# Patient Record
Sex: Female | Born: 1965 | Race: White | Hispanic: No | Marital: Married | Smoking: Never smoker
Health system: Southern US, Community
[De-identification: ages and names within clinical notes are randomized; demographics above are authoritative.]

## PROBLEM LIST (undated history)

## (undated) DIAGNOSIS — I10 Essential (primary) hypertension: Secondary | ICD-10-CM

## (undated) HISTORY — PX: NASAL SINUS SURGERY: SHX719

---

## 2007-08-11 ENCOUNTER — Emergency Department: Payer: Self-pay | Admitting: Emergency Medicine

## 2008-03-01 ENCOUNTER — Ambulatory Visit: Payer: Self-pay | Admitting: Family Medicine

## 2010-01-06 ENCOUNTER — Ambulatory Visit: Payer: Self-pay | Admitting: Family Medicine

## 2011-01-25 HISTORY — PX: ABDOMINAL HYSTERECTOMY: SHX81

## 2011-06-02 ENCOUNTER — Ambulatory Visit: Payer: Self-pay | Admitting: Internal Medicine

## 2011-07-03 ENCOUNTER — Emergency Department: Payer: Self-pay | Admitting: Unknown Physician Specialty

## 2011-08-09 ENCOUNTER — Ambulatory Visit: Payer: Self-pay | Admitting: Internal Medicine

## 2011-09-30 ENCOUNTER — Ambulatory Visit: Payer: Self-pay

## 2013-01-07 ENCOUNTER — Ambulatory Visit: Payer: Self-pay | Admitting: Emergency Medicine

## 2013-01-07 LAB — URINALYSIS, COMPLETE
Glucose,UR: NEGATIVE mg/dL (ref 0–75)
Leukocyte Esterase: NEGATIVE
Nitrite: POSITIVE
Ph: 6.5 (ref 4.5–8.0)
Protein: NEGATIVE
Specific Gravity: 1.025 (ref 1.003–1.030)

## 2013-01-10 LAB — URINE CULTURE

## 2013-09-15 DIAGNOSIS — G5620 Lesion of ulnar nerve, unspecified upper limb: Secondary | ICD-10-CM | POA: Insufficient documentation

## 2013-09-15 DIAGNOSIS — M7710 Lateral epicondylitis, unspecified elbow: Secondary | ICD-10-CM | POA: Insufficient documentation

## 2013-11-23 ENCOUNTER — Encounter: Payer: Self-pay | Admitting: Orthopedic Surgery

## 2013-11-26 ENCOUNTER — Encounter: Payer: Self-pay | Admitting: Orthopedic Surgery

## 2013-12-24 ENCOUNTER — Encounter: Payer: Self-pay | Admitting: Orthopedic Surgery

## 2014-01-24 ENCOUNTER — Encounter: Payer: Self-pay | Admitting: Orthopedic Surgery

## 2014-02-04 ENCOUNTER — Ambulatory Visit: Payer: Self-pay

## 2014-09-21 ENCOUNTER — Ambulatory Visit: Payer: Self-pay | Admitting: Physician Assistant

## 2015-05-08 DIAGNOSIS — D126 Benign neoplasm of colon, unspecified: Secondary | ICD-10-CM | POA: Insufficient documentation

## 2015-07-19 DIAGNOSIS — M25851 Other specified joint disorders, right hip: Secondary | ICD-10-CM | POA: Insufficient documentation

## 2015-07-19 DIAGNOSIS — M25569 Pain in unspecified knee: Secondary | ICD-10-CM | POA: Insufficient documentation

## 2016-02-24 HISTORY — PX: HIP SURGERY: SHX245

## 2016-10-27 ENCOUNTER — Ambulatory Visit
Admission: EM | Admit: 2016-10-27 | Discharge: 2016-10-27 | Disposition: A | Discharge status: Home / Self Care | Payer: BC Managed Care – PPO | Attending: Family Medicine | Admitting: Family Medicine

## 2016-10-27 DIAGNOSIS — J0100 Acute maxillary sinusitis, unspecified: Secondary | ICD-10-CM

## 2016-10-27 DIAGNOSIS — H6982 Other specified disorders of Eustachian tube, left ear: Secondary | ICD-10-CM | POA: Diagnosis not present

## 2016-10-27 MED ORDER — FEXOFENADINE-PSEUDOEPHED ER 180-240 MG PO TB24: 1.0000 | ORAL_TABLET | Freq: Every day | ORAL | 0 refills | Status: DC

## 2016-10-27 MED ORDER — FLUTICASONE PROPIONATE 50 MCG/ACT NA SUSP: 2.0000 | Freq: Every day | NASAL | 0 refills | Status: DC

## 2016-10-27 MED ORDER — AMOXICILLIN-POT CLAVULANATE 875-125 MG PO TABS: 1.0000 | ORAL_TABLET | Freq: Two times a day (BID) | ORAL | 0 refills | Status: DC

## 2016-10-27 NOTE — ED Triage Notes (Signed)
Patient complains of sinus pain and pressure. Patient states that she started feeling bad one week ago. Patient states that she has facial pain and pressure with headaches and some ear pain. Patient states that she has tried several OTC meds without relief.

## 2016-10-27 NOTE — ED Provider Notes (Signed)
[facility]    CSN: [id number] Arrival date & time: 10/27/16  1027     History   Chief Complaint Chief Complaint  Patient presents with  . Sinusitis    HPI Monica Day is a 51 y.o. female.   Patient is a 30 old white female with a history of sinus problems and URI like problems on Christmas Day roughly 9 days ago. She reports using over-the-counter medications no things and things got worse. States his been having for years since she's had any Cold or congestion and she thought she could Dr. this herself but the symptoms got worse. States by Sunday increased pressure over both sides of face and she started having pain in the left ear. Pain left ear concerns or she was worried that she had an ear infection whith the sinus infections so she came in to be seen. No known drug allergies. She had hip surgery earlier this spring. No known drug allergies no chronic medical problems no pertinent family medical history relevant to today's visit and she is never smoked.   The history is provided by the patient.  Sinusitis  Pain details:    Location:  Maxillary and frontal   Quality:  Aching   Severity:  Moderate   Timing:  Constant Progression:  Worsening Chronicity:  New Context: recent URI   Context: not allergies, not chemical odor, not deviated nasal septum and not smoke inhalation   Relieved by:  Nothing Worsened by:  Nothing Ineffective treatments:  Oral decongestants and saline sprays Associated symptoms: cough, ear pain and rhinorrhea     History reviewed. No pertinent past medical history.  There are no active problems to display for this patient.   Past Surgical History:  Procedure Laterality Date  . ABDOMINAL HYSTERECTOMY  01/2011  . HIP SURGERY Right 02/2016   Reconstruction  . NASAL SINUS SURGERY      OB History    No data available       Home Medications    Prior to Admission medications   Medication Sig Start Date End Date Taking?  Authorizing Provider  Ascorbic Acid (VITAMIN C) 1000 MG tablet Take 1,000 mg by mouth daily.   Yes Historical Provider, MD  cholecalciferol (VITAMIN D) 400 units TABS tablet Take 400 Units by mouth.   Yes Historical Provider, MD  magnesium oxide (MAG-OX) 400 MG tablet Take 400 mg by mouth daily.   Yes Historical Provider, MD  Multiple Vitamin (MULTIVITAMIN) tablet Take 1 tablet by mouth daily.   Yes Historical Provider, MD  amoxicillin-clavulanate (AUGMENTIN) 875-125 MG tablet Take 1 tablet by mouth 2 (two) times daily. 10/27/16   Hassan Rowan, MD  fexofenadine-pseudoephedrine (ALLEGRA-D ALLERGY & CONGESTION) 180-240 MG 24 hr tablet Take 1 tablet by mouth daily. 10/27/16   Hassan Rowan, MD  fluticasone (FLONASE) 50 MCG/ACT nasal spray Place 2 sprays into both nostrils daily. 10/27/16   Hassan Rowan, MD    Family History History reviewed. No pertinent family history.  Social History Social History  Substance Use Topics  . Smoking status: Never Smoker  . Smokeless tobacco: Never Used  . Alcohol use Yes     Comment: occasionally     Allergies   Patient has no known allergies.   Review of Systems Review of Systems  HENT: Positive for ear pain, rhinorrhea, sinus pain, sinus pressure and trouble swallowing.   Respiratory: Positive for cough.   All other systems reviewed and are negative.    Physical Exam Triage  Vital Signs ED Triage Vitals  Enc Vitals Group     BP 10/27/16 1056 (!) 150/84     Pulse Rate 10/27/16 1056 93     Resp 10/27/16 1056 17     Temp 10/27/16 1056 98.5 F (36.9 C)     Temp Source 10/27/16 1056 Oral     SpO2 10/27/16 1056 99 %     Weight 10/27/16 1054 150 lb (68 kg)     Height 10/27/16 1054 5\' 8"  (1.727 m)     Head Circumference --      Peak Flow --      Pain Score 10/27/16 1056 4     Pain Loc --      Pain Edu? --      Excl. in GC? --    No data found.   Updated Vital Signs BP (!) 150/84 (BP Location: Left Arm)   Pulse 93   Temp 98.5 F (36.9 C)  (Oral)   Resp 17   Ht 5\' 8"  (1.727 m)   Wt 150 lb (68 kg)   SpO2 99%   BMI 22.81 kg/m   Visual Acuity Right Eye Distance:   Left Eye Distance:   Bilateral Distance:    Right Eye Near:   Left Eye Near:    Bilateral Near:     Physical Exam  Constitutional: She is oriented to person, place, and time. She appears well-developed and well-nourished.  HENT:  Head: Normocephalic and atraumatic.  Right Ear: Hearing, tympanic membrane, external ear and ear canal normal.  Left Ear: Hearing, external ear and ear canal normal. Tympanic membrane is bulging.  Nose: Mucosal edema and sinus tenderness present. Right sinus exhibits maxillary sinus tenderness and frontal sinus tenderness. Left sinus exhibits maxillary sinus tenderness and frontal sinus tenderness.  Mouth/Throat: Uvula is midline. Posterior oropharyngeal edema and posterior oropharyngeal erythema present.  Eyes: EOM are normal. Pupils are equal, round, and reactive to light.  Neck: Normal range of motion. Neck supple. No JVD present.  Pulmonary/Chest: Effort normal.  Musculoskeletal: Normal range of motion.  Lymphadenopathy:    She has cervical adenopathy.  Neurological: She is alert and oriented to person, place, and time.  Skin: Skin is warm.  Psychiatric: She has a normal mood and affect.  Vitals reviewed.    UC Treatments / Results  Labs (all labs ordered are listed, but only abnormal results are displayed) Labs Reviewed - No data to display  EKG  EKG Interpretation None       Radiology No results found.  Procedures Procedures (including critical care time)  Medications Ordered in UC Medications - No data to display   Initial Impression / Assessment and Plan / UC Course  I have reviewed the triage vital signs and the nursing notes.  Pertinent labs & imaging results that were available during my care of the patient were reviewed by me and considered in my medical decision making (see chart for  details).  Clinical Course    Patient informed nothing she has a eustachian tube dysfunction. Gave instruction to use Flonase nasal spray. We'll place on Allegra-D 1 tablet daily because this may going on now for roughly 9 days we'll place her on Augmentin 875 one tablet twice a day. So she does not go to work until Thursday she declined work note at this time. Follow-up with PCP of choice as needed.   Final Clinical Impressions(s) / UC Diagnoses   Final diagnoses:  Acute maxillary sinusitis, recurrence not specified  Eustachian tube dysfunction, left    New Prescriptions Discharge Medication List as of 10/27/2016 12:21 PM    START taking these medications   Details  amoxicillin-clavulanate (AUGMENTIN) 875-125 MG tablet Take 1 tablet by mouth 2 (two) times daily., Starting Tue 10/27/2016, Normal    fexofenadine-pseudoephedrine (ALLEGRA-D ALLERGY & CONGESTION) 180-240 MG 24 hr tablet Take 1 tablet by mouth daily., Starting Tue 10/27/2016, Normal    fluticasone (FLONASE) 50 MCG/ACT nasal spray Place 2 sprays into both nostrils daily., Starting Tue 10/27/2016, Normal        Note: This dictation was prepared with Dragon dictation along with smaller phrase technology. Any transcriptional errors that result from this process are unintentional.   Hassan Rowan, MD 10/27/16 1234

## 2016-11-17 ENCOUNTER — Ambulatory Visit (INDEPENDENT_AMBULATORY_CARE_PROVIDER_SITE_OTHER)
Admission: EM | Admit: 2016-11-17 | Discharge: 2016-11-17 | Disposition: A | Discharge status: Other Acute Inpatient Hospital | Payer: BC Managed Care – PPO | Source: Home / Self Care | Attending: Family Medicine | Admitting: Family Medicine

## 2016-11-17 ENCOUNTER — Encounter: Payer: Self-pay | Admitting: Emergency Medicine

## 2016-11-17 ENCOUNTER — Emergency Department: Payer: BC Managed Care – PPO

## 2016-11-17 ENCOUNTER — Encounter: Payer: Self-pay | Admitting: Radiology

## 2016-11-17 ENCOUNTER — Observation Stay
Admission: EM | Admit: 2016-11-17 | Discharge: 2016-11-18 | Disposition: A | Discharge status: Home / Self Care | Payer: BC Managed Care – PPO | Attending: Internal Medicine | Admitting: Internal Medicine

## 2016-11-17 DIAGNOSIS — I10 Essential (primary) hypertension: Secondary | ICD-10-CM | POA: Insufficient documentation

## 2016-11-17 DIAGNOSIS — R2990 Unspecified symptoms and signs involving the nervous system: Secondary | ICD-10-CM | POA: Diagnosis not present

## 2016-11-17 DIAGNOSIS — Z79899 Other long term (current) drug therapy: Secondary | ICD-10-CM | POA: Diagnosis not present

## 2016-11-17 DIAGNOSIS — R42 Dizziness and giddiness: Secondary | ICD-10-CM | POA: Insufficient documentation

## 2016-11-17 DIAGNOSIS — R0989 Other specified symptoms and signs involving the circulatory and respiratory systems: Secondary | ICD-10-CM

## 2016-11-17 DIAGNOSIS — M79602 Pain in left arm: Secondary | ICD-10-CM | POA: Diagnosis not present

## 2016-11-17 DIAGNOSIS — I639 Cerebral infarction, unspecified: Secondary | ICD-10-CM | POA: Diagnosis present

## 2016-11-17 DIAGNOSIS — R51 Headache: Secondary | ICD-10-CM | POA: Insufficient documentation

## 2016-11-17 DIAGNOSIS — G8194 Hemiplegia, unspecified affecting left nondominant side: Secondary | ICD-10-CM | POA: Diagnosis not present

## 2016-11-17 DIAGNOSIS — R531 Weakness: Secondary | ICD-10-CM | POA: Insufficient documentation

## 2016-11-17 DIAGNOSIS — R200 Anesthesia of skin: Secondary | ICD-10-CM | POA: Diagnosis not present

## 2016-11-17 DIAGNOSIS — I69354 Hemiplegia and hemiparesis following cerebral infarction affecting left non-dominant side: Secondary | ICD-10-CM

## 2016-11-17 DIAGNOSIS — E876 Hypokalemia: Secondary | ICD-10-CM | POA: Diagnosis not present

## 2016-11-17 DIAGNOSIS — R29898 Other symptoms and signs involving the musculoskeletal system: Secondary | ICD-10-CM | POA: Diagnosis present

## 2016-11-17 DIAGNOSIS — R509 Fever, unspecified: Secondary | ICD-10-CM | POA: Insufficient documentation

## 2016-11-17 HISTORY — DX: Essential (primary) hypertension: I10

## 2016-11-17 LAB — TYPE AND SCREEN
ABO/RH(D): O POS
Antibody Screen: NEGATIVE

## 2016-11-17 LAB — COMPREHENSIVE METABOLIC PANEL
ALT: 25 U/L (ref 14–54)
AST: 31 U/L (ref 15–41)
Albumin: 4 g/dL (ref 3.5–5.0)
Alkaline Phosphatase: 66 U/L (ref 38–126)
Anion gap: 7 (ref 5–15)
BUN: 14 mg/dL (ref 6–20)
CHLORIDE: 104 mmol/L (ref 101–111)
CO2: 26 mmol/L (ref 22–32)
CREATININE: 0.69 mg/dL (ref 0.44–1.00)
Calcium: 8.6 mg/dL — ABNORMAL LOW (ref 8.9–10.3)
GFR calc Af Amer: >60 mL/min (ref >60)
GFR calc non Af Amer: >60 mL/min (ref >60)
Glucose, Bld: 116 mg/dL — ABNORMAL HIGH (ref 65–99)
POTASSIUM: 3.1 mmol/L — AB (ref 3.5–5.1)
SODIUM: 137 mmol/L (ref 135–145)
Total Bilirubin: 0.6 mg/dL (ref 0.3–1.2)
Total Protein: 7.2 g/dL (ref 6.5–8.1)

## 2016-11-17 LAB — URINE DRUG SCREEN, QUALITATIVE (ARMC ONLY)
Amphetamines, Ur Screen: NOT DETECTED
BARBITURATES, UR SCREEN: NOT DETECTED
Benzodiazepine, Ur Scrn: NOT DETECTED
COCAINE METABOLITE, UR ~~LOC~~: NOT DETECTED
Cannabinoid 50 Ng, Ur ~~LOC~~: NOT DETECTED
MDMA (Ecstasy)Ur Screen: NOT DETECTED
METHADONE SCREEN, URINE: NOT DETECTED
OPIATE, UR SCREEN: NOT DETECTED
Phencyclidine (PCP) Ur S: NOT DETECTED
Tricyclic, Ur Screen: NOT DETECTED

## 2016-11-17 LAB — DIFFERENTIAL
BASOS ABS: 0 10*3/uL (ref 0–0.1)
BASOS PCT: 0 %
Eosinophils Absolute: 0.1 10*3/uL (ref 0–0.7)
Eosinophils Relative: 2 %
Lymphocytes Relative: 27 %
Lymphs Abs: 1.6 10*3/uL (ref 1.0–3.6)
Monocytes Absolute: 0.7 10*3/uL (ref 0.2–0.9)
Monocytes Relative: 11 %
NEUTROS ABS: 3.5 10*3/uL (ref 1.4–6.5)
Neutrophils Relative %: 60 %

## 2016-11-17 LAB — LIPID PANEL
CHOL/HDL RATIO: 3.4 ratio
Cholesterol: 183 mg/dL (ref 0–200)
HDL: 54 mg/dL (ref >40)
LDL CALC: 102 mg/dL — AB (ref 0–99)
Triglycerides: 134 mg/dL (ref <150)
VLDL: 27 mg/dL (ref 0–40)

## 2016-11-17 LAB — CBC
HCT: 39.5 % (ref 35.0–47.0)
Hemoglobin: 14.1 g/dL (ref 12.0–16.0)
MCH: 32.4 pg (ref 26.0–34.0)
MCHC: 35.8 g/dL (ref 32.0–36.0)
MCV: 90.5 fL (ref 80.0–100.0)
PLATELETS: 172 10*3/uL (ref 150–440)
RBC: 4.36 MIL/uL (ref 3.80–5.20)
RDW: 12.6 % (ref 11.5–14.5)
WBC: 5.9 10*3/uL (ref 3.6–11.0)

## 2016-11-17 LAB — TROPONIN I: Troponin I: <0.03 ng/mL (ref <0.03)

## 2016-11-17 LAB — PROTIME-INR
INR: 0.93
PROTHROMBIN TIME: 12.5 s (ref 11.4–15.2)

## 2016-11-17 LAB — APTT: APTT: 26 s (ref 24–36)

## 2016-11-17 LAB — ETHANOL: Alcohol, Ethyl (B): <5 mg/dL (ref <5)

## 2016-11-17 MED ORDER — ACETAMINOPHEN 325 MG PO TABS
650.0000 mg | ORAL_TABLET | ORAL | Status: DC | PRN
  Administered 2016-11-17 – 2016-11-18 (×3): 650 mg via ORAL
  Filled 2016-11-17 (×3): qty 2

## 2016-11-17 MED ORDER — IOPAMIDOL (ISOVUE-370) INJECTION 76%
75.0000 mL | Freq: Once | INTRAVENOUS | Status: AC | PRN
Start: 2016-11-17 — End: 2016-11-17
  Administered 2016-11-17: 75 mL via INTRAVENOUS

## 2016-11-17 MED ORDER — ASPIRIN 81 MG PO CHEW
324.0000 mg | CHEWABLE_TABLET | Freq: Once | ORAL | Status: AC
  Administered 2016-11-17: 324 mg via ORAL
  Filled 2016-11-17: qty 4

## 2016-11-17 MED ORDER — ACETAMINOPHEN 160 MG/5ML PO SOLN: 650.0000 mg | ORAL | Status: DC | PRN

## 2016-11-17 MED ORDER — ACETAMINOPHEN 650 MG RE SUPP: 650.0000 mg | RECTAL | Status: DC | PRN

## 2016-11-17 NOTE — ED Notes (Signed)
Pt up to bathrroom with assistance and voided and threw toilet tissue into urine sample.  Pt now unable to void again.  Labs sent again.  nsr on monitor.

## 2016-11-17 NOTE — ED Triage Notes (Signed)
[facility] numbness and tingling in L arm since 2:30pm today, headache, and fevers for two days. Patient reports she started feeling very dizzy also around 2:30pm. Pt has been taking Tylenol at home for fevers. A& Ox4. Denies chest pain or SOB

## 2016-11-17 NOTE — ED Notes (Signed)
Report off  To Mertha FindersKasey Rn

## 2016-11-17 NOTE — ED Notes (Addendum)
Dr. Morene AntuHillard Sharf, Tele[facility] currently talking with pt.

## 2016-11-17 NOTE — Progress Notes (Signed)
CH responded to a PG for CODE Stroke. Pt was alert and calm. Husband was bedside. CH provided the ministry of presence and prayer. CH is available for follow up as needed.    11/17/16 1900  Clinical Encounter Type  Visited With Patient;Patient and family together  Visit Type Initial;Spiritual support;Code;ED (CODE Stroke)  Referral From Nurse  Spiritual Encounters  Spiritual Needs Emotional

## 2016-11-17 NOTE — ED Notes (Signed)
Patient transported to CT 

## 2016-11-17 NOTE — ED Notes (Signed)
Pt passed Stroke Swallow Screen without difficulty.

## 2016-11-17 NOTE — ED Notes (Signed)
EMS called to transport patient to ED 

## 2016-11-17 NOTE — ED Provider Notes (Signed)
[facility] Emergency Department Provider Note  ____________________________________________   First MD Initiated Contact with Patient 11/17/16 1828     (approximate)  I have reviewed the triage vital signs and the nursing notes.   HISTORY  Chief Complaint Numbness; Dizziness; and Headache    HPI Monica Day is a 51 y.o. female Who presents to the emergency department for evaluation of strokelike symptoms.in short, she started feeling ill yesterday with fever to 100.5 general malaise, and generalized headache.  She had no numbness nr weakness in any of her extremities at that time.  She continued to feel ill throughout the day, and ibuprofen and Tylenol did not help.  today she felt about the same and then at about 2:30 PM (approximately 4-5 hours prior to arrival) she felt acute onset of numbness, tingling, and weakness in her left arm.  She states that her arm and her head feel "heavy".  she went to an urgent care for evaluationand they recommended that she come to the emergency department for evaluation of possible CVA.  She currently has persistent weakness in her left arm.  she also discovered that she has some weakness in her left leg.  She continues to have a mild headache.  she describes her symptoms as moderate and nothing makes them better nor worse.  She denieschest pain, shortness of breath, nausea, vomiting, abdominal pain, dysuria.   History reviewed. No pertinent past medical history.  Patient Active Problem List   Diagnosis Date Noted  . CVA (cerebral vascular accident) (HCC) 11/17/2016    Past Surgical History:  Procedure Laterality Date  . ABDOMINAL HYSTERECTOMY  01/2011  . HIP SURGERY Right 02/2016   Reconstruction  . NASAL SINUS SURGERY      Prior to Admission medications   Medication Sig Start Date End Date Taking? Authorizing Provider  Ascorbic Acid (VITAMIN C) 1000 MG tablet Take 1,000 mg by mouth daily.   Yes  Historical Provider, MD  cholecalciferol (VITAMIN D) 400 units TABS tablet Take 400 Units by mouth.   Yes Historical Provider, MD  fluticasone (FLONASE) 50 MCG/ACT nasal spray Place 2 sprays into both nostrils daily. 10/27/16  Yes Hassan Rowan, MD  hydrochlorothiazide (HYDRODIURIL) 25 MG tablet Take 25 mg by mouth daily.   Yes Historical Provider, MD  magnesium oxide (MAG-OX) 400 MG tablet Take 400 mg by mouth daily.   Yes Historical Provider, MD  Multiple Vitamin (MULTIVITAMIN) tablet Take 1 tablet by mouth daily.   Yes Historical Provider, MD    Allergies Patient has no known allergies.  No family history on file.  Social History Social History  Substance Use Topics  . Smoking status: Never Smoker  . Smokeless tobacco: Never Used  . Alcohol use Yes     Comment: occasionally    Review of Systems Constitutional: fever to 100.5 yesterday, general malaise Eyes: No visual changes. ENT: No sore throat. Cardiovascular: Denies chest pain. Respiratory: Denies shortness of breath. Gastrointestinal: No abdominal pain.  No nausea, no vomiting.  No diarrhea.  No constipation. Genitourinary: Negative for dysuria. Musculoskeletal: Negative for back pain. Skin: Negative for rash. Neurological: generalized headache.  Weakness and numbness in her left arm, acute onset approximately 5 hours ago  10-point ROS otherwise negative.  ____________________________________________   PHYSICAL EXAM:  VITAL SIGNS: ED Triage Vitals [11/17/16 1753]  Enc Vitals Group     BP (!) 150/85     Pulse Rate 83     Resp 12  Temp 98.8 F (37.1 C)     Temp Source Oral     SpO2 99 %     Weight      Height      Head Circumference      Peak Flow      Pain Score      Pain Loc      Pain Edu?      Excl. in GC?     Constitutional: Alert and oriented. Well appearing and in no acute distress. Eyes: Conjunctivae are normal. PERRL. EOMI. Head: Atraumatic. Nose: No congestion/rhinnorhea. Mouth/Throat:  Mucous membranes are moist.  Oropharynx non-erythematous. Neck: No stridor.  No meningeal signs.   Cardiovascular: Normal rate, regular rhythm. Good peripheral circulation. Grossly normal heart sounds. Respiratory: Normal respiratory effort.  No retractions. Lungs CTAB. Gastrointestinal: Soft and nontender. No distention.  Musculoskeletal: No lower extremity tenderness nor edema. No gross deformities of extremities. Neurologic:  Normal speech and language. unable to keep left upper extremity nor left lower extremity at 45 against gravity for 10 seconds.  See NIH stroke scale below Skin:  Skin is warm, dry and intact. No rash noted. Psychiatric: Mood and affect are normal. Speech and behavior are normal.  ____________________________________________   LABS (all labs ordered are listed, but only abnormal results are displayed)  Labs Reviewed  COMPREHENSIVE METABOLIC PANEL - Abnormal; Notable for the following:       Result Value   Potassium 3.1 (*)    Glucose, Bld 116 (*)    Calcium 8.6 (*)    All other components within normal limits  LIPID PANEL - Abnormal; Notable for the following:    LDL Cholesterol 102 (*)    All other components within normal limits  CBC  DIFFERENTIAL  TROPONIN I  PROTIME-INR  APTT  ETHANOL  URINE DRUG SCREEN, QUALITATIVE ([facility] ONLY)  HEMOGLOBIN A1C  CBG MONITORING, ED  TYPE AND SCREEN   ____________________________________________  EKG  ED ECG REPORT I, Kirston Luty, the attending physician, personally viewed and interpreted this ECG.  Date: 11/17/2016 EKG Time: 16:36 Rate: 96 Rhythm: normal sinus rhythm QRS Axis: normal Intervals: normal ST/T Wave abnormalities: normal Conduction Disturbances: none Narrative Interpretation: unremarkable  ____________________________________________  RADIOLOGY   Ct Angio Head W Or Wo Contrast  Result Date: 11/17/2016 CLINICAL DATA:  LEFT arm numbness and tingling beginning at 2:30 today. Headache and  fever for 2 days. Dizziness. Assess acute stroke. EXAM: CT ANGIOGRAPHY HEAD AND NECK TECHNIQUE: Multidetector CT imaging of the head and neck was performed using the standard protocol during bolus administration of intravenous contrast. Multiplanar CT image reconstructions and MIPs were obtained to evaluate the vascular anatomy. Carotid stenosis measurements (when applicable) are obtained utilizing NASCET criteria, using the distal internal carotid diameter as the denominator. CONTRAST:  75 cc Isovue 370 COMPARISON:  CT HEAD November 17, 2016 at 1807 hours FINDINGS: CTA NECK AORTIC ARCH: Normal appearance of the thoracic arch, normal branch pattern. The origins of the innominate, left Common carotid artery and subclavian artery are widely patent. RIGHT CAROTID SYSTEM: Common carotid artery is widely patent, coursing in a straight line fashion. Normal appearance of the carotid bifurcation without hemodynamically significant stenosis by NASCET criteria. Normal appearance of the included internal carotid artery. LEFT CAROTID SYSTEM: Common carotid artery is widely patent, coursing in a straight line fashion. Normal appearance of the carotid bifurcation without hemodynamically significant stenosis by NASCET criteria. Normal appearance of the included internal carotid artery. VERTEBRAL ARTERIES:Codominant vertebral arteries. Normal appearance of the  vertebral arteries, which appear widely patent. SKELETON: No acute osseous process though bone windows have not been submitted. Moderate C4-5 and C5-6 degenerative discs resulting and mild to moderate RIGHT C4-5, moderate to severe RIGHT C5-6 neural foraminal narrowing. OTHER NECK: Soft tissues of the neck are non-acute though, not tailored for evaluation. CTA HEAD ANTERIOR CIRCULATION: Normal appearance of the cervical internal carotid arteries, petrous, cavernous and supra clinoid internal carotid arteries. Widely patent anterior communicating artery. Normal appearance of  the anterior and middle cerebral arteries. No large vessel occlusion, hemodynamically significant stenosis, dissection, luminal irregularity, contrast extravasation or aneurysm. POSTERIOR CIRCULATION: Normal appearance of the vertebral arteries, vertebrobasilar junction and basilar artery, as well as main branch vessels. Normal appearance of the posterior cerebral arteries. No large vessel occlusion, hemodynamically significant stenosis, dissection, luminal irregularity, contrast extravasation or aneurysm. VENOUS SINUSES: Major dural venous sinuses are patent though not tailored for evaluation on this angiographic examination. ANATOMIC VARIANTS: None. DELAYED PHASE: Not performed. IMPRESSION: Normal CTA NECK. Normal CTA HEAD. Electronically Signed   By: Awilda Metro M.D.   On: 11/17/2016 21:15   Ct Angio Neck W And/or Wo Contrast  Result Date: 11/17/2016 CLINICAL DATA:  LEFT arm numbness and tingling beginning at 2:30 today. Headache and fever for 2 days. Dizziness. Assess acute stroke. EXAM: CT ANGIOGRAPHY HEAD AND NECK TECHNIQUE: Multidetector CT imaging of the head and neck was performed using the standard protocol during bolus administration of intravenous contrast. Multiplanar CT image reconstructions and MIPs were obtained to evaluate the vascular anatomy. Carotid stenosis measurements (when applicable) are obtained utilizing NASCET criteria, using the distal internal carotid diameter as the denominator. CONTRAST:  75 cc Isovue 370 COMPARISON:  CT HEAD November 17, 2016 at 1807 hours FINDINGS: CTA NECK AORTIC ARCH: Normal appearance of the thoracic arch, normal branch pattern. The origins of the innominate, left Common carotid artery and subclavian artery are widely patent. RIGHT CAROTID SYSTEM: Common carotid artery is widely patent, coursing in a straight line fashion. Normal appearance of the carotid bifurcation without hemodynamically significant stenosis by NASCET criteria. Normal appearance of  the included internal carotid artery. LEFT CAROTID SYSTEM: Common carotid artery is widely patent, coursing in a straight line fashion. Normal appearance of the carotid bifurcation without hemodynamically significant stenosis by NASCET criteria. Normal appearance of the included internal carotid artery. VERTEBRAL ARTERIES:Codominant vertebral arteries. Normal appearance of the vertebral arteries, which appear widely patent. SKELETON: No acute osseous process though bone windows have not been submitted. Moderate C4-5 and C5-6 degenerative discs resulting and mild to moderate RIGHT C4-5, moderate to severe RIGHT C5-6 neural foraminal narrowing. OTHER NECK: Soft tissues of the neck are non-acute though, not tailored for evaluation. CTA HEAD ANTERIOR CIRCULATION: Normal appearance of the cervical internal carotid arteries, petrous, cavernous and supra clinoid internal carotid arteries. Widely patent anterior communicating artery. Normal appearance of the anterior and middle cerebral arteries. No large vessel occlusion, hemodynamically significant stenosis, dissection, luminal irregularity, contrast extravasation or aneurysm. POSTERIOR CIRCULATION: Normal appearance of the vertebral arteries, vertebrobasilar junction and basilar artery, as well as main branch vessels. Normal appearance of the posterior cerebral arteries. No large vessel occlusion, hemodynamically significant stenosis, dissection, luminal irregularity, contrast extravasation or aneurysm. VENOUS SINUSES: Major dural venous sinuses are patent though not tailored for evaluation on this angiographic examination. ANATOMIC VARIANTS: None. DELAYED PHASE: Not performed. IMPRESSION: Normal CTA NECK. Normal CTA HEAD. Electronically Signed   By: Awilda Metro M.D.   On: 11/17/2016 21:15   Ct Head Code Stroke W/o  Cm  Result Date: 11/17/2016 CLINICAL DATA:  Code stroke. LEFT arm pain and numbness for 1 hour. Fever and body aches beginning yesterday. EXAM: CT  HEAD WITHOUT CONTRAST TECHNIQUE: Contiguous axial images were obtained from the base of the skull through the vertex without intravenous contrast. COMPARISON:  None. FINDINGS: BRAIN: The ventricles and sulci are normal. No intraparenchymal hemorrhage, mass effect nor midline shift. No acute large vascular territory infarcts. No abnormal extra-axial fluid collections. Basal cisterns are patent. VASCULAR: Unremarkable. SKULL/SOFT TISSUES: No skull fracture. No significant soft tissue swelling. ORBITS/SINUSES: The included ocular globes and orbital contents are normal.Mild paranasal sinus mucosal thickening. Mastoid air cells are well aerated. OTHER: None. ASPECTS [facility] Stroke Program Early CT Score) - Ganglionic level infarction (caudate, lentiform nuclei, internal capsule, insula, M1-M3 cortex): 7 - Supraganglionic infarction (M4-M6 cortex): 3 Total score (0-10 with 10 being normal): 10 IMPRESSION: 1. Negative CT HEAD. 2. ASPECTS is 10. Critical Value/emergent results were called by telephone at the time of interpretation on 11/17/2016 at 6:26 pm to Dr. York Cerise, who verbally acknowledged these results. Electronically Signed   By: Awilda Metro M.D.   On: 11/17/2016 18:27    ____________________________________________   PROCEDURES  Procedure(s) performed:   .Critical Care Performed by: Loleta Rose Authorized by: Loleta Rose   Critical care provider statement:    Critical care time (minutes):  45   Critical care time was exclusive of:  Separately billable procedures and treating other patients   Critical care was necessary to treat or prevent imminent or life-threatening deterioration of the following conditions:  CNS failure or compromise   Critical care was time spent personally by me on the following activities:  Development of treatment plan with patient or surrogate, discussions with consultants, evaluation of patient's response to treatment, examination of patient, obtaining history  from patient or surrogate, ordering and performing treatments and interventions, ordering and review of laboratory studies, ordering and review of radiographic studies, pulse oximetry, re-evaluation of patient's condition and review of old charts      Critical Care performed: Yes, see critical care procedure note(s) ____________________________________________   INITIAL IMPRESSION / ASSESSMENT AND PLAN / ED COURSE  Pertinent labs & imaging results that were available during my care of the patient were reviewed by me and considered in my medical decision making (see chart for details).  NIH Stroke Scale  Interval: Baseline Time: approx 1800 Person Administering Scale: Beautiful [place]yl  Administer stroke scale items in the order listed. Record performance in each category after each subscale exam. Do not go back and change scores. Follow directions provided for each exam technique. Scores should reflect what the patient does, not what the clinician thinks the patient can do. The clinician should record answers while administering the exam and work quickly. Except where indicated, the patient should not be coached (i.e., repeated requests to patient to make a special effort).   1a  Level of consciousness: 0=alert; keenly responsive  1b. LOC questions:  0=Performs both tasks correctly  1c. LOC commands: 0=Performs both tasks correctly  2.  Best Gaze: 0=normal  3.  Visual: 0=No visual loss  4. Facial Palsy: 2=Partial paralysis (total or near total paralysis of the lower face)  5a.  Motor left arm: 1=Drift, limb holds 90 (or 45) degrees but drifts down before full 10 seconds: does not hit bed  5b.  Motor right arm: 0=No drift, limb holds 90 (or 45) degrees for full 10 seconds  6a. motor left leg: 1=Drift, limb holds  90 (or 45) degrees but drifts down before full 10 seconds: does not hit bed  6b  Motor right leg:  0=No drift, limb holds 90 (or 45) degrees for full 10 seconds  7. Limb Ataxia:  0=Absent  8.  Sensory: 1=Mild to moderate sensory loss; patient feels pinprick is less sharp or is dull on the affected side; there is a loss of superficial pain with pinprick but patient is aware She is being touched  9. Best Language:  0=No aphasia, normal  10. Dysarthria: 0=Normal  11. Extinction and Inattention: 0=No abnormality  12. Distal motor function: 0=Normal   Total:   5   Neurologist on call and I both agree that she is NOT a tPA candidate due to being outside the window as well as having relatively minor symptoms.  See hospital course for more details.   Clinical Course as of Nov 18 2135  Tue Nov 17, 2016  1851 Radiologist called to tell me there are no acute findings evident on the non-contrast head CT. CT Head Code Stroke W/O CM [CF]  1947 Delayed documentation due to multiple critical ED patients.  In short, I spoke with the neurology SOC over the computer.  I brought up the possibility of sinus venous thrombosis given her recent sinus disease, but he feels this is less likely than acute CVA.  However, given that she is right at the window and has relatively minor symptoms, he does not feel she is a good tPA candidate, and I agree.  We recommended CTA neck and head to look for acute occlusions, as well as the standard stroke workup.  I had a discussion with the patient and spouse about transfer vs staying at [facility], but given no indication that she would need acute intervention (such as clot removal), we will hold off on transfer at this time.  I ordered the CTAs, ordered a full dose ASA, and will admit for further management of her CVA.  [CF]  2006 Reviewed the written report from the neurologist to confirm the plan for CTA neck and head.  Swallow screen is being performed now and she will get aspirin immediately after passing a swallow screen.  Her vital signs remain stable and her blood pressure appropriate.  We will defer additional management to the inpatient team though I am adding  on the blood work that we know will need to happen such as lipid profile and hemoglobin A1c.  [CF]  2132 Normal CTAs.  Discussed with Dr. Renato Shin (hospitalist).  Also discussed getting an MRV to rule out sinus venous thrombosis while she is ordering the usual battery of MR imaging.  She agrees with the plan.  [CF]  2133 Updated patient.  She reports the tingling has improved.  [CF]    Clinical Course User Index [CF] Loleta Rose, MD    ____________________________________________  FINAL CLINICAL IMPRESSION(S) / ED DIAGNOSES  Final diagnoses:  Acute CVA (cerebrovascular accident) [facility])     MEDICATIONS GIVEN DURING THIS VISIT:  Medications  aspirin chewable tablet 324 mg (324 mg Oral Given 11/17/16 2013)  iopamidol (ISOVUE-370) 76 % injection 75 mL (75 mLs Intravenous Contrast Given 11/17/16 2032)     NEW OUTPATIENT MEDICATIONS STARTED DURING THIS VISIT:  New Prescriptions   No medications on file    Modified Medications   No medications on file    Discontinued Medications   No medications on file     Note:  This document was prepared using Dragon voice recognition software  and may include unintentional dictation errors.    Loleta Rose, MD 11/17/16 2137

## 2016-11-17 NOTE — ED Triage Notes (Signed)
Patient c/o left arm numbness and pain that started a hour ago today.  Patient denies Chest pain or SOB.  Patient reports fever and bodyaches that started yesterday.

## 2016-11-17 NOTE — H&P (Signed)
[facility] - [place] @ [facility] Admission History and Physical [employer], D.O.  ---------------------------------------------------------------------------------------------------------------------   PATIENT NAME: Monica Day MR#: [id number] DATE OF BIRTH: [birth year] DATE OF ADMISSION: 11/17/2016 PRIMARY CARE PHYSICIAN: Pcp Not In System  REQUESTING/REFERRING PHYSICIAN: ED Dr. York Cerise  CHIEF COMPLAINT: Chief Complaint  Patient presents with  . Numbness  . Dizziness  . Headache    HISTORY OF PRESENT ILLNESS: Monica Day is a 51 y.o. female with a known history of HTN, HLD, migraine was in a usual state of health until Yesterday when she developed flulike symptoms including fever to 100.5, body aches, headache, nasal congestion. She took some Motrin for her symptoms with no significant improvement. About 2:30 on the day of arrival to the emergency department about 4-5 hours prior to her arrival here she developed numbness and tingling of the left upper extremity. She is evaluated by urgent care and referred to the emergency department for objective left upper extremity weakness. She reports her symptoms have persisted and have not changed. Her husband notes that is about the same time that the left upper extremity no paresthesias started she did have a left-sided facial droop. Patient denied any perceived weakness, numbness or tingling in the left lower extremity. She denied speech difficulty swallowing difficulty, vision disturbance. At this time her only complaint is mild headache.  Of note patient reports that she had been diagnosed with hypertension several years ago for which she was taking medication however she lost 20 pounds and her hypertension improved. Recently her blood pressures have been in the 170s to 180s and she was started back on hydrochlorothiazide about 10 days ago. She has been monitoring her blood pressure at home and it has been in the 130s to 140s  systolic.  Otherwise there has been no change in status. Patient has been taking medication as prescribed and there has been no other recent change in medication or diet.  There has been no recent illness, travel or sick contacts.    Patient denies fevers/chills, weakness, dizziness, chest pain, shortness of breath, N/V/C/D, abdominal pain, dysuria/frequency, changes in mental status.   EMS/ED COURSE:  Patient rec'd aspirin 325. She was seen by teleneurology   PAST MEDICAL HISTORY: Hypertension, hyperlipidemia, migraine headache    PAST SURGICAL HISTORY: Past Surgical History:  Procedure Laterality Date  . ABDOMINAL HYSTERECTOMY  01/2011  . HIP SURGERY Right 02/2016   Reconstruction  . NASAL SINUS SURGERY        SOCIAL HISTORY: Social History  Substance Use Topics  . Smoking status: Never Smoker  . Smokeless tobacco: Never Used  . Alcohol use Yes     Comment: occasionally      FAMILY HISTORY: Mother died by suicide and early age Father had lung cancer and prostate cancer 2 brothers with hypertension She has 2 sons were living and healthy   MEDICATIONS AT HOME: Prior to Admission medications   Medication Sig Start Date End Date Taking? Authorizing Provider  Ascorbic Acid (VITAMIN C) 1000 MG tablet Take 1,000 mg by mouth daily.   Yes Historical Provider, MD  cholecalciferol (VITAMIN D) 400 units TABS tablet Take 400 Units by mouth.   Yes Historical Provider, MD  fluticasone (FLONASE) 50 MCG/ACT nasal spray Place 2 sprays into both nostrils daily. 10/27/16  Yes Hassan Rowan, MD  hydrochlorothiazide (HYDRODIURIL) 25 MG tablet Take 25 mg by mouth daily.   Yes Historical Provider, MD  magnesium oxide (MAG-OX) 400 MG tablet Take 400 mg by mouth daily.  Yes Historical Provider, MD  Multiple Vitamin (MULTIVITAMIN) tablet Take 1 tablet by mouth daily.   Yes Historical Provider, MD      DRUG ALLERGIES: No Known Allergies   REVIEW OF SYSTEMS: CONSTITUTIONAL: Positive  fever/chills, fatigue, weakness, headache EYES: No blurry or double vision. ENT: No tinnitus, postnasal drip, redness or soreness of the oropharynx. RESPIRATORY: No cough, wheeze, hemoptysis, dyspnea. CARDIOVASCULAR: No chest pain, orthopnea, palpitations, syncope. GASTROINTESTINAL: No nausea, vomiting, constipation, diarrhea, abdominal pain, hematemesis, melena or hematochezia. GENITOURINARY: No dysuria or hematuria. ENDOCRINE: No polyuria or nocturia. No heat or cold intolerance. HEMATOLOGY: No anemia, bruising, bleeding. INTEGUMENTARY: No rashes, ulcers, lesions. MUSCULOSKELETAL: No arthritis, swelling, gout. NEUROLOGIC: Positive left upper extremity numbness and tingling with weakness. Positive left-sided facial droop. No seizure-type activity. PSYCHIATRIC: No anxiety, depression, insomnia.  PHYSICAL EXAMINATION: VITAL SIGNS: Blood pressure (!) 149/91, pulse 85, temperature 98.6 F (37 C), resp. rate 13, SpO2 99 %.  GENERAL: 51 y.o.-year-old  white female patient, well-developed, well-nourished lying in the bed in no acute distress.  Pleasant and cooperative.   HEENT: Head atraumatic, normocephalic. Pupils equal, round, reactive to light and accommodation. No scleral icterus. Extraocular muscles intact. Nares are patent. Oropharynx is clear. Mucus membranes moist. NECK: Supple, full range of motion. No JVD, no bruit heard. No thyroid enlargement, no tenderness, no cervical lymphadenopathy. CHEST: Normal breath sounds bilaterally. No wheezing, rales, rhonchi or crackles. No use of accessory muscles of respiration.  No reproducible chest wall tenderness.  CARDIOVASCULAR: S1, S2 normal. No murmurs, rubs, or gallops. Cap refill <2 seconds. ABDOMEN: Soft, nontender, nondistended. No rebound, guarding, rigidity. Normoactive bowel sounds present in all four quadrants. No organomegaly or mass. EXTREMITIES: Full range of motion. No pedal edema, cyanosis, or clubbing. NEUROLOGIC: there is a  left-sided facial droop in the lower half of the left face as well as right-sided deviation of the tongue. Muscle strength in the left upper and lower extremity is 3/5, right upper and lower extremity 5/5. There is diminished sensation lateral to the left eye as well as in the left upper extremity PSYCHIATRIC: The patient is alert and oriented x 3. Normal affect, mood, thought content. SKIN: Warm, dry, and intact without obvious rash, lesion, or ulcer.  1a  Level of consciousness: 0=alert; keenly responsive  1b. LOC questions:  0=Performs both tasks correctly  1c. LOC commands: 0=Performs both tasks correctly  2.  Best Gaze: 0=normal  3.  Visual: 0=No visual loss  4. Facial Palsy: 2=Partial paralysis (total or near total paralysis of the lower face)  5a.  Motor left arm: 1=Drift, limb holds 90 (or 45) degrees but drifts down before full 10 seconds: does not hit bed  5b.  Motor right arm: 0=No drift, limb holds 90 (or 45) degrees for full 10 seconds  6a. motor left leg: 1=Drift, limb holds 90 (or 45) degrees but drifts down before full 10 seconds: does not hit bed  6b  Motor right leg:  0=No drift, limb holds 90 (or 45) degrees for full 10 seconds  7. Limb Ataxia: 0=Absent  8.  Sensory: 1=Mild to moderate sensory loss; patient feels pinprick is less sharp or is dull on the affected side; there is a loss of superficial pain with pinprick but patient is aware She is being touched  9. Best Language:  0=No aphasia, normal  10. Dysarthria: 0=Normal  11. Extinction and Inattention: 0=No abnormality  12. Distal motor function: 0=Normal   Total:   5     LABORATORY PANEL:  CBC  Recent Labs Lab 11/17/16 1758  WBC 5.9  HGB 14.1  HCT 39.5  PLT 172   ----------------------------------------------------------------------------------------------------------------- Chemistries  Recent Labs Lab 11/17/16 1758  NA 137  K 3.1*  CL 104  CO2 26  GLUCOSE 116*  BUN 14  CREATININE 0.69   CALCIUM 8.6*  AST 31  ALT 25  ALKPHOS 66  BILITOT 0.6   ------------------------------------------------------------------------------------------------------------------ Cardiac Enzymes  Recent Labs Lab 11/17/16 1758  TROPONINI <0.03   ------------------------------------------------------------------------------------------------------------------  RADIOLOGY: Ct Head Code Stroke W/o Cm  Result Date: 11/17/2016 CLINICAL DATA:  Code stroke. LEFT arm pain and numbness for 1 hour. Fever and body aches beginning yesterday. EXAM: CT HEAD WITHOUT CONTRAST TECHNIQUE: Contiguous axial images were obtained from the base of the skull through the vertex without intravenous contrast. COMPARISON:  None. FINDINGS: BRAIN: The ventricles and sulci are normal. No intraparenchymal hemorrhage, mass effect nor midline shift. No acute large vascular territory infarcts. No abnormal extra-axial fluid collections. Basal cisterns are patent. VASCULAR: Unremarkable. SKULL/SOFT TISSUES: No skull fracture. No significant soft tissue swelling. ORBITS/SINUSES: The included ocular globes and orbital contents are normal.Mild paranasal sinus mucosal thickening. Mastoid air cells are well aerated. OTHER: None. ASPECTS [facility]([place] Stroke Program Early CT Score) - Ganglionic level infarction (caudate, lentiform nuclei, internal capsule, insula, M1-M3 cortex): 7 - Supraganglionic infarction (M4-M6 cortex): 3 Total score (0-10 with 10 being normal): 10 IMPRESSION: 1. Negative CT HEAD. 2. ASPECTS is 10. Critical Value/emergent results were called by telephone at the time of interpretation on 11/17/2016 at 6:26 pm to Dr. York CeriseFORBACH, who verbally acknowledged these results. Electronically Signed   By: Awilda Metroourtnay  Bloomer M.D.   On: 11/17/2016 18:27    EKG: normal sinus rhythm at 96 bpm with nonspecific ST and T wave changes. Normal axis  IMPRESSION AND PLAN:  This is a 51 y.o. female with a history of HTN, HLD, migraine now being  admitted with:  1. CVA with negative CT, CTA head and neck.  - Admit telemetry observation for neuro workup including: - Studies: MRA/MRI/MRV, Echo, Carotids - Labs: CBC, BMP, Lipids, TFTs, A1C - Nursing: Neurochecks, O2, dysphagia screen, permissive hypertension.  - Consults: Neurology, PT/OT, S/S consults.  - Meds: Daily aspirin 81mg .   - Fluids: IVNS@75cc /hr.   - Routine DVT Px: with Lovenox, SCDs, early ambulation  2. Hypokalemia, mild - Replace PO  Code Status: Full  All the records are reviewed and case discussed with ED provider. Management plans discussed with the patient and/or family who express understanding and agree with plan of care.   TOTAL TIME TAKING CARE OF THIS PATIENT: 60 minutes.   Deshaun Weisinger D.O. on 11/17/2016 at 9:16 PM Between 7am to 6pm - Pager - [phone] After 6pm go to [email or web address] - Social research officer, governmentpassword EPAS [facility] [place] [facility] Office [phone] CC: Primary care physician; Pcp Not In System     Note: This dictation was prepared with Dragon dictation along with smaller phrase technology. Any transcriptional errors that result from this process are unintentional.

## 2016-11-17 NOTE — ED Provider Notes (Signed)
[facility]    CSN: [id number] Arrival date & time: 11/17/16  1608     History   Chief Complaint Chief Complaint  Patient presents with  . Numbness  . Arm Pain    left arm  . Fever    HPI Monica Day is a 51 y.o. female.   Is here after experiencing pain in her left arm and numbness left arm. She states about 2:30 this afternoon she started having numbness and pain in the left arm. Turns out that yesterday she started feeling like she is coming down with something didn't feel well but didn't feel good and was we didn't see if she was going to develop signs of the foot. She reports about 2:30 this afternoon the numbness and arm weakness of the arm and hand and difficulty using arm and hand. She is feels heavy in her head she is still dizzy and she feels lightheaded as well. No nausea vomiting. She's not had any history of cardiac vascular disease except that she does have a history of hypertension. She states she lost about 25 pounds a few years ago taken her off medication but the last few weeks she's noted some blood pressure running 160-170. She was recently restarted on a diuretic by her PCP. Family medical history mother died of suicide no illness father and grandfather both had prostate cancer. She denies any chest pain or shortness of breath at this time but does not feel good   The history is provided by the patient and the spouse. No language interpreter was used.  Arm Pain  This is a new problem. The current episode started 3 to 5 hours ago. The problem has not changed since onset.Pertinent negatives include no chest pain, no headaches and no shortness of breath. Nothing aggravates the symptoms. Nothing relieves the symptoms. She has tried nothing for the symptoms. The treatment provided no relief.  Fever  Associated symptoms: myalgias   Associated symptoms: no chest pain and no headaches     History reviewed. No pertinent past medical history.  There  are no active problems to display for this patient.   Past Surgical History:  Procedure Laterality Date  . ABDOMINAL HYSTERECTOMY  01/2011  . HIP SURGERY Right 02/2016   Reconstruction  . NASAL SINUS SURGERY      OB History    No data available       Home Medications    Prior to Admission medications   Medication Sig Start Date End Date Taking? Authorizing Provider  Ascorbic Acid (VITAMIN C) 1000 MG tablet Take 1,000 mg by mouth daily.    Historical Provider, MD  cholecalciferol (VITAMIN D) 400 units TABS tablet Take 400 Units by mouth.    Historical Provider, MD  fluticasone (FLONASE) 50 MCG/ACT nasal spray Place 2 sprays into both nostrils daily. 10/27/16   Hassan RowanEugene Kimmerly Lora, MD  magnesium oxide (MAG-OX) 400 MG tablet Take 400 mg by mouth daily.    Historical Provider, MD  Multiple Vitamin (MULTIVITAMIN) tablet Take 1 tablet by mouth daily.    Historical Provider, MD    Family History History reviewed. No pertinent family history.  Social History Social History  Substance Use Topics  . Smoking status: Never Smoker  . Smokeless tobacco: Never Used  . Alcohol use Yes     Comment: occasionally     Allergies   Patient has no known allergies.   Review of Systems Review of Systems  Constitutional: Positive for fever.  Respiratory:  Negative for shortness of breath.   Cardiovascular: Negative for chest pain.  Musculoskeletal: Positive for myalgias and neck stiffness.  Neurological: Positive for weakness. Negative for headaches.  All other systems reviewed and are negative.    Physical Exam Triage Vital Signs ED Triage Vitals  Enc Vitals Group     BP 11/17/16 1614 (!) 133/91     Pulse Rate 11/17/16 1614 (!) 106     Resp 11/17/16 1614 16     Temp 11/17/16 1614 98.1 F (36.7 C)     Temp Source 11/17/16 1614 Oral     SpO2 11/17/16 1614 100 %     Weight 11/17/16 1615 154 lb (69.9 kg)     Height 11/17/16 1615 5\' 8"  (1.727 m)     Head Circumference --      Peak Flow  --      Pain Score 11/17/16 1617 3     Pain Loc --      Pain Edu? --      Excl. in GC? --    No data found.   Updated Vital Signs BP (!) 133/91 (BP Location: Left Arm)   Pulse (!) 106   Temp 98.1 F (36.7 C) (Oral)   Resp 16   Ht 5\' 8"  (1.727 m)   Wt 154 lb (69.9 kg)   SpO2 100%   BMI 23.42 kg/m   Visual Acuity Right Eye Distance:   Left Eye Distance:   Bilateral Distance:    Right Eye Near:   Left Eye Near:    Bilateral Near:     Physical Exam  Constitutional: She is oriented to person, place, and time. She appears well-developed and well-nourished.  HENT:  Head: Normocephalic.  Eyes: Pupils are equal, round, and reactive to light.  Neck: Normal range of motion. Neck supple.  Cardiovascular: Normal rate and regular rhythm.   Pulmonary/Chest: Effort normal.  Abdominal: Soft.  Musculoskeletal: Normal range of motion. She exhibits no edema or deformity.  Neurological: She is alert and oriented to person, place, and time.  Patient with weakness of the left arm and left hand slight but is weaker than the right. Unable to reproduce any pain or symptoms by palpating the neck or the shoulder  Skin: Skin is warm.  Psychiatric: She has a normal mood and affect.  Vitals reviewed.    UC Treatments / Results  Labs (all labs ordered are listed, but only abnormal results are displayed) Labs Reviewed - No data to display  EKG  EKG Interpretation None      ED ECG REPORT I, Emberly Tomasso H, the attending physician, personally viewed and interpreted this ECG.   Date: 11/17/2016  EKG Time: 116:30:59  Rate:96  Rhythm: normal EKG, normal sinus rhythm, there are no previous tracings available for comparison  Axis: 43  Intervals:none  ST&T Change: none Radiology No results found.  Procedures Procedures (including critical care time)  Medications Ordered in UC Medications - No data to display   Initial Impression / Assessment and Plan / UC Course  I have  reviewed the triage vital signs and the nursing notes.  Pertinent labs & imaging results that were available during my care of the patient were reviewed by me and considered in my medical decision making (see chart for details).     Cousins of the weakness on the left side his symptoms and history patient be transferred to [facility] ED for evaluation. Discussed with charge nurse gel and she is aware of her  pending arrival by EMS  Final Clinical Impressions(s) / UC Diagnoses   Final diagnoses:  Left arm pain  Numbness  Symptoms of cerebrovascular accident (CVA)    New Prescriptions Discharge Medication List as of 11/17/2016  5:32 PM      Note: This dictation was prepared with Dragon dictation along with smaller phrase technology. Any transcriptional errors that result from this process are unintentional.   Hassan Rowan, MD 11/17/16 1753

## 2016-11-17 NOTE — ED Notes (Signed)
[facility] with numbness in left arm/shoulder.  Pt reports headache and fever since yesterday. Pt also reports dizziness and feeling weak on the left side.  Pt alert.  Speech clear now.

## 2016-11-17 NOTE — ED Notes (Signed)
Pt returned from CT °

## 2016-11-17 NOTE — ED Notes (Signed)
Notified CT scan IV above wrist is completed. States they will be there soon to get pt.

## 2016-11-18 ENCOUNTER — Observation Stay: Payer: BC Managed Care – PPO

## 2016-11-18 ENCOUNTER — Observation Stay: Admit: 2016-11-18 | Payer: BC Managed Care – PPO

## 2016-11-18 DIAGNOSIS — G8194 Hemiplegia, unspecified affecting left nondominant side: Secondary | ICD-10-CM

## 2016-11-18 DIAGNOSIS — R29898 Other symptoms and signs involving the musculoskeletal system: Secondary | ICD-10-CM | POA: Diagnosis present

## 2016-11-18 LAB — URINALYSIS, DIPSTICK ONLY
BILIRUBIN URINE: NEGATIVE
Glucose, UA: NEGATIVE mg/dL
HGB URINE DIPSTICK: NEGATIVE
Ketones, ur: NEGATIVE mg/dL
Leukocytes, UA: NEGATIVE
Nitrite: NEGATIVE
PROTEIN: NEGATIVE mg/dL
Specific Gravity, Urine: 1.038 — ABNORMAL HIGH (ref 1.005–1.030)
pH: 6 (ref 5.0–8.0)

## 2016-11-18 LAB — LIPID PANEL
CHOLESTEROL: 172 mg/dL (ref 0–200)
HDL: 49 mg/dL (ref >40)
LDL CALC: 101 mg/dL — AB (ref 0–99)
TRIGLYCERIDES: 109 mg/dL (ref <150)
Total CHOL/HDL Ratio: 3.5 RATIO
VLDL: 22 mg/dL (ref 0–40)

## 2016-11-18 LAB — TROPONIN I

## 2016-11-18 MED ORDER — ADULT MULTIVITAMIN W/MINERALS CH
1.0000 | ORAL_TABLET | Freq: Every day | ORAL | Status: DC
  Administered 2016-11-18: 09:00:00 1 via ORAL
  Filled 2016-11-18: qty 1

## 2016-11-18 MED ORDER — FLUTICASONE PROPIONATE 50 MCG/ACT NA SUSP
2.0000 | Freq: Every day | NASAL | Status: DC
  Administered 2016-11-18: 2 via NASAL
  Filled 2016-11-18: qty 16

## 2016-11-18 MED ORDER — IBUPROFEN 400 MG PO TABS
400.0000 mg | ORAL_TABLET | Freq: Four times a day (QID) | ORAL | Status: DC | PRN
  Filled 2016-11-18: qty 1

## 2016-11-18 MED ORDER — CHOLECALCIFEROL 10 MCG (400 UNIT) PO TABS
400.0000 [IU] | ORAL_TABLET | Freq: Every day | ORAL | Status: DC
  Administered 2016-11-18: 400 [IU] via ORAL
  Filled 2016-11-18: qty 1

## 2016-11-18 MED ORDER — POTASSIUM CHLORIDE CRYS ER 20 MEQ PO TBCR
40.0000 meq | EXTENDED_RELEASE_TABLET | Freq: Once | ORAL | Status: AC
  Administered 2016-11-18: 40 meq via ORAL
  Filled 2016-11-18: qty 2

## 2016-11-18 MED ORDER — MAGNESIUM OXIDE 400 (241.3 MG) MG PO TABS
400.0000 mg | ORAL_TABLET | Freq: Every day | ORAL | Status: DC
  Administered 2016-11-18: 400 mg via ORAL
  Filled 2016-11-18: qty 1

## 2016-11-18 MED ORDER — VITAMIN C 500 MG PO TABS
1000.0000 mg | ORAL_TABLET | Freq: Every day | ORAL | Status: DC
Start: 2016-11-18 — End: 2016-11-18
  Administered 2016-11-18: 1000 mg via ORAL
  Filled 2016-11-18: qty 2

## 2016-11-18 MED ORDER — SENNOSIDES-DOCUSATE SODIUM 8.6-50 MG PO TABS: 1.0000 | ORAL_TABLET | Freq: Every evening | ORAL | Status: DC | PRN

## 2016-11-18 MED ORDER — STROKE: EARLY STAGES OF RECOVERY BOOK
Freq: Once | Status: AC
Start: 2016-11-18 — End: 2016-11-18
  Administered 2016-11-18: 01:00:00

## 2016-11-18 MED ORDER — HYDROCHLOROTHIAZIDE 25 MG PO TABS: 25.0000 mg | ORAL_TABLET | Freq: Every day | ORAL | Status: DC

## 2016-11-18 MED ORDER — ENOXAPARIN SODIUM 40 MG/0.4ML ~~LOC~~ SOLN: 40.0000 mg | SUBCUTANEOUS | Status: DC

## 2016-11-18 MED ORDER — LORAZEPAM 2 MG/ML IJ SOLN
0.5000 mg | Freq: Once | INTRAMUSCULAR | Status: AC
Start: 2016-11-18 — End: 2016-11-18
  Administered 2016-11-18: 0.5 mg via INTRAVENOUS
  Filled 2016-11-18: qty 1

## 2016-11-18 MED ORDER — SODIUM CHLORIDE 0.9 % IV SOLN
INTRAVENOUS | Status: DC
  Administered 2016-11-18: 01:00:00 via INTRAVENOUS

## 2016-11-18 MED ORDER — ASPIRIN 81 MG PO TBEC: 81.0000 mg | DELAYED_RELEASE_TABLET | Freq: Every day | ORAL | Status: DC

## 2016-11-18 MED ORDER — ASPIRIN EC 81 MG PO TBEC
81.0000 mg | DELAYED_RELEASE_TABLET | Freq: Every day | ORAL | Status: DC
  Administered 2016-11-18: 81 mg via ORAL
  Filled 2016-11-18: qty 1

## 2016-11-18 NOTE — Care Management (Signed)
Out patient physical therapy and occupational therapy recommended. Rehabilitation Services referral faxed.                                                                                                              Gwenette GreetBrenda S Hiroyuki Ozanich RN MSN CCM Care Management

## 2016-11-18 NOTE — Progress Notes (Signed)
SLP Cancellation Note  Patient Details Name: Monica Day MRN: [id number] DOB: 11/29/1965   Cancelled treatment:       Reason Eval/Treat Not Completed: SLP screened, no needs identified, will sign off (Chart reviewed; NSG consulted) Consulted pt and husband. Pt denied any trouble with speech/language stating she is at her baseline. Husband stated "no trouble with word finding or slurred speech". Pt also denied any trouble with swallowing stating she "took her vitamins fine this morning". NSG to re-consult if any change in status while admitted.    Ardelia MemsHannah Schwoerer, B.S Graduate Clinician  11/18/2016, 11:20 AM   This information has been reviewed and agreed upon by this supervising clinician.  Jerilynn SomKatherine Watson, MS, CCC-SLP

## 2016-11-18 NOTE — Consult Note (Addendum)
Referring Physician: Sudini    Chief Complaint: Left sided numbness and weakness  HPI: Vinnie LevelSarah Coburn Wixted is an 51 y.o. female who reports developing fever, headache, nasal congestion and body aches on 1/22.  Then on yesterday noted that her left arm became numb.  This progressed to involve the left side of her face.  She noted on PT evaluation today that her left leg was weak as well.  Initial NIHSS of 5.   Continues to have headache that she describes as throbbing with an intensity of 4/10.  Pain across the forehead.    Date last known well: Date: 11/17/2016 Time last known well: Time: 14:30 tPA Given: No: Minor symptoms  Past Medical History:  Diagnosis Date  . Hypertension     Past Surgical History:  Procedure Laterality Date  . ABDOMINAL HYSTERECTOMY  01/2011  . HIP SURGERY Right 02/2016   Reconstruction  . NASAL SINUS SURGERY      Family History  Problem Relation Age of Onset  . Lung cancer Father   . Prostate cancer Father   . Hypertension Brother   . Hypertension Brother    Social History:  reports that she has never smoked. She has never used smokeless tobacco. She reports that she drinks alcohol. She reports that she does not use drugs.  Allergies: No Known Allergies  Medications:  I have reviewed the patient's current medications. Prior to Admission:  Prescriptions Prior to Admission  Medication Sig Dispense Refill Last Dose  . Ascorbic Acid (VITAMIN C) 1000 MG tablet Take 1,000 mg by mouth daily.   Past Week at unknown  . cholecalciferol (VITAMIN D) 400 units TABS tablet Take 400 Units by mouth.   Past Week at unknown  . fluticasone (FLONASE) 50 MCG/ACT nasal spray Place 2 sprays into both nostrils daily. 16 g 0   . hydrochlorothiazide (HYDRODIURIL) 25 MG tablet Take 25 mg by mouth daily.   11/17/2016 at 0730  . magnesium oxide (MAG-OX) 400 MG tablet Take 400 mg by mouth daily.   Past Week at Unknown time  . Multiple Vitamin (MULTIVITAMIN) tablet Take 1 tablet  by mouth daily.   Past Week at Unknown time   Scheduled: . aspirin EC  81 mg Oral Daily  . cholecalciferol  400 Units Oral Daily  . enoxaparin (LOVENOX) injection  40 mg Subcutaneous Q24H  . fluticasone  2 spray Each Nare Daily  . magnesium oxide  400 mg Oral Daily  . multivitamin with minerals  1 tablet Oral Daily  . vitamin C  1,000 mg Oral Daily    ROS: History obtained from the patient  General ROS: as noted in HPI Psychological ROS: negative for - behavioral disorder, hallucinations, memory difficulties, mood swings or suicidal ideation Ophthalmic ROS: negative for - blurry vision, double vision, eye pain or loss of vision ENT ROS: as noted in HPI Allergy and Immunology ROS: negative for - hives or itchy/watery eyes Hematological and Lymphatic ROS: negative for - bleeding problems, bruising or swollen lymph nodes Endocrine ROS: negative for - galactorrhea, hair pattern changes, polydipsia/polyuria or temperature intolerance Respiratory ROS: as noted in HPI Cardiovascular ROS: negative for - chest pain, dyspnea on exertion, edema or irregular heartbeat Gastrointestinal ROS: negative for - abdominal pain, diarrhea, hematemesis, nausea/vomiting or stool incontinence Genito-Urinary ROS: negative for - dysuria, hematuria, incontinence or urinary frequency/urgency Musculoskeletal ROS: negative for - joint swelling or muscular weakness Neurological ROS: as noted in HPI Dermatological ROS: negative for rash and skin lesion changes  Physical Examination: Blood pressure 125/74, pulse 73, temperature 97.9 F (36.6 C), temperature source Oral, resp. rate 18, height 5\' 8"  (1.727 m), weight 69.4 kg (153 lb 1.6 oz), SpO2 98 %.  HEENT-  Normocephalic, no lesions, without obvious abnormality.  Normal external eye and conjunctiva.  Normal TM's bilaterally.  Normal auditory canals and external ears. Normal external nose, mucus membranes and septum.  Normal pharynx. Cardiovascular- S1, S2 normal,  pulses palpable throughout   Lungs- chest clear, no wheezing, rales, normal symmetric air entry Abdomen- soft, non-tender; bowel sounds normal; no masses,  no organomegaly Extremities- no edema Lymph-no adenopathy palpable Musculoskeletal-no joint tenderness, deformity or swelling Skin-warm and dry, no hyperpigmentation, vitiligo, or suspicious lesions  Neurological Examination Mental Status: Alert, oriented, thought content appropriate.  Speech fluent without evidence of aphasia.  Able to follow 3 step commands without difficulty. Cranial Nerves: II: Discs flat bilaterally; Visual fields grossly normal, pupils equal, round, reactive to light and accommodation III,IV, VI: ptosis not present, extra-ocular motions intact bilaterally V,VII: left facial droop noted with smiling but not wit htalking, facial light touch sensation decreased on the left VIII: hearing normal bilaterally IX,X: gag reflex present XI: bilateral shoulder shrug XII: midline tongue extension Motor: Right : Upper extremity   5/5    Left:     Upper extremity   4/5 with drift but no pronation  Lower extremity   5/5     Lower extremity   4/5 Tone and bulk:normal tone throughout; no atrophy noted Sensory: Pinprick and light touch decreased on the LUE Deep Tendon Reflexes: 2+ and symmetric throughout Plantars: Right: downgoing   Left: downgoing Cerebellar: Normal finger-to-nose and normal heel-to-shin testing bilaterally Gait: not tested due to safety concerns    Laboratory Studies:  Basic Metabolic Panel:  Recent Labs Lab 11/17/16 1758  NA 137  K 3.1*  CL 104  CO2 26  GLUCOSE 116*  BUN 14  CREATININE 0.69  CALCIUM 8.6*    Liver Function Tests:  Recent Labs Lab 11/17/16 1758  AST 31  ALT 25  ALKPHOS 66  BILITOT 0.6  PROT 7.2  ALBUMIN 4.0   No results for input(s): LIPASE, AMYLASE in the last 168 hours. No results for input(s): AMMONIA in the last 168 hours.  CBC:  Recent Labs Lab  11/17/16 1758  WBC 5.9  NEUTROABS 3.5  HGB 14.1  HCT 39.5  MCV 90.5  PLT 172    Cardiac Enzymes:  Recent Labs Lab 11/17/16 1758 11/18/16 0116 11/18/16 0658  TROPONINI <0.03 <0.03 <0.03    BNP: Invalid input(s): POCBNP  CBG: No results for input(s): GLUCAP in the last 168 hours.  Microbiology: Results for orders placed or performed in visit on 01/07/13  Urine culture     Status: None   Collection Time: 01/07/13 10:18 AM  Result Value Ref Range Status   Micro Text Report   Final       SOURCE: CLEAN CATCH URINE    ORGANISM 1                >100,000 CFU/ML ESCHERICHIA COLI   ORGANISM 2                60,000 CFU STREPTOCOCCUS VIRIDANS   COMMENT                   -   ANTIBIOTIC                    ORG#1  AMPICILLIN                    R         CEFAZOLIN                     S         CEFOXITIN                     S         CEFTRIAXONE                   S         CIPROFLOXACIN                 S         GENTAMICIN                    S         IMIPENEM                      S         LEVOFLOXACIN                  S         NITROFURANTOIN                S         TRIMETHOPRIM/SULFAMETHOXAZOLE S             Coagulation Studies:  Recent Labs  11/17/16 1758  LABPROT 12.5  INR 0.93    Urinalysis:  Recent Labs Lab 11/17/16 2103  COLORURINE YELLOW*  LABSPEC 1.038*  PHURINE 6.0  GLUCOSEU NEGATIVE  HGBUR NEGATIVE  BILIRUBINUR NEGATIVE  KETONESUR NEGATIVE  PROTEINUR NEGATIVE  NITRITE NEGATIVE  LEUKOCYTESUR NEGATIVE    Lipid Panel:    Component Value Date/Time   CHOL 172 11/18/2016 0658   TRIG 109 11/18/2016 0658   HDL 49 11/18/2016 0658   CHOLHDL 3.5 11/18/2016 0658   VLDL 22 11/18/2016 0658   LDLCALC 101 (H) 11/18/2016 0658    HgbA1C: No results found for: HGBA1C  Urine Drug Screen:     Component Value Date/Time   LABOPIA NONE DETECTED 11/17/2016 2103   COCAINSCRNUR NONE DETECTED 11/17/2016 2103   LABBENZ NONE DETECTED 11/17/2016 2103    AMPHETMU NONE DETECTED 11/17/2016 2103   THCU NONE DETECTED 11/17/2016 2103   LABBARB NONE DETECTED 11/17/2016 2103    Alcohol Level:  Recent Labs Lab 11/17/16 1844  ETH <5    Other results: EKG: normal sinus rhythm at 96 bpm.  Imaging: Ct Angio Head W Or Wo Contrast  Result Date: 11/17/2016 CLINICAL DATA:  LEFT arm numbness and tingling beginning at 2:30 today. Headache and fever for 2 days. Dizziness. Assess acute stroke. EXAM: CT ANGIOGRAPHY HEAD AND NECK TECHNIQUE: Multidetector CT imaging of the head and neck was performed using the standard protocol during bolus administration of intravenous contrast. Multiplanar CT image reconstructions and MIPs were obtained to evaluate the vascular anatomy. Carotid stenosis measurements (when applicable) are obtained utilizing NASCET criteria, using the distal internal carotid diameter as the denominator. CONTRAST:  75 cc Isovue 370 COMPARISON:  CT HEAD November 17, 2016 at 1807 hours FINDINGS: CTA NECK AORTIC ARCH: Normal appearance of the thoracic arch, normal branch pattern. The origins of the innominate, left Common carotid artery and subclavian artery are widely patent. RIGHT  CAROTID SYSTEM: Common carotid artery is widely patent, coursing in a straight line fashion. Normal appearance of the carotid bifurcation without hemodynamically significant stenosis by NASCET criteria. Normal appearance of the included internal carotid artery. LEFT CAROTID SYSTEM: Common carotid artery is widely patent, coursing in a straight line fashion. Normal appearance of the carotid bifurcation without hemodynamically significant stenosis by NASCET criteria. Normal appearance of the included internal carotid artery. VERTEBRAL ARTERIES:Codominant vertebral arteries. Normal appearance of the vertebral arteries, which appear widely patent. SKELETON: No acute osseous process though bone windows have not been submitted. Moderate C4-5 and C5-6 degenerative discs resulting and mild  to moderate RIGHT C4-5, moderate to severe RIGHT C5-6 neural foraminal narrowing. OTHER NECK: Soft tissues of the neck are non-acute though, not tailored for evaluation. CTA HEAD ANTERIOR CIRCULATION: Normal appearance of the cervical internal carotid arteries, petrous, cavernous and supra clinoid internal carotid arteries. Widely patent anterior communicating artery. Normal appearance of the anterior and middle cerebral arteries. No large vessel occlusion, hemodynamically significant stenosis, dissection, luminal irregularity, contrast extravasation or aneurysm. POSTERIOR CIRCULATION: Normal appearance of the vertebral arteries, vertebrobasilar junction and basilar artery, as well as main branch vessels. Normal appearance of the posterior cerebral arteries. No large vessel occlusion, hemodynamically significant stenosis, dissection, luminal irregularity, contrast extravasation or aneurysm. VENOUS SINUSES: Major dural venous sinuses are patent though not tailored for evaluation on this angiographic examination. ANATOMIC VARIANTS: None. DELAYED PHASE: Not performed. IMPRESSION: Normal CTA NECK. Normal CTA HEAD. Electronically Signed   By: Awilda Metro M.D.   On: 11/17/2016 21:15   Ct Angio Neck W And/or Wo Contrast  Result Date: 11/17/2016 CLINICAL DATA:  LEFT arm numbness and tingling beginning at 2:30 today. Headache and fever for 2 days. Dizziness. Assess acute stroke. EXAM: CT ANGIOGRAPHY HEAD AND NECK TECHNIQUE: Multidetector CT imaging of the head and neck was performed using the standard protocol during bolus administration of intravenous contrast. Multiplanar CT image reconstructions and MIPs were obtained to evaluate the vascular anatomy. Carotid stenosis measurements (when applicable) are obtained utilizing NASCET criteria, using the distal internal carotid diameter as the denominator. CONTRAST:  75 cc Isovue 370 COMPARISON:  CT HEAD November 17, 2016 at 1807 hours FINDINGS: CTA NECK AORTIC ARCH:  Normal appearance of the thoracic arch, normal branch pattern. The origins of the innominate, left Common carotid artery and subclavian artery are widely patent. RIGHT CAROTID SYSTEM: Common carotid artery is widely patent, coursing in a straight line fashion. Normal appearance of the carotid bifurcation without hemodynamically significant stenosis by NASCET criteria. Normal appearance of the included internal carotid artery. LEFT CAROTID SYSTEM: Common carotid artery is widely patent, coursing in a straight line fashion. Normal appearance of the carotid bifurcation without hemodynamically significant stenosis by NASCET criteria. Normal appearance of the included internal carotid artery. VERTEBRAL ARTERIES:Codominant vertebral arteries. Normal appearance of the vertebral arteries, which appear widely patent. SKELETON: No acute osseous process though bone windows have not been submitted. Moderate C4-5 and C5-6 degenerative discs resulting and mild to moderate RIGHT C4-5, moderate to severe RIGHT C5-6 neural foraminal narrowing. OTHER NECK: Soft tissues of the neck are non-acute though, not tailored for evaluation. CTA HEAD ANTERIOR CIRCULATION: Normal appearance of the cervical internal carotid arteries, petrous, cavernous and supra clinoid internal carotid arteries. Widely patent anterior communicating artery. Normal appearance of the anterior and middle cerebral arteries. No large vessel occlusion, hemodynamically significant stenosis, dissection, luminal irregularity, contrast extravasation or aneurysm. POSTERIOR CIRCULATION: Normal appearance of the vertebral arteries, vertebrobasilar junction and basilar artery,  as well as main branch vessels. Normal appearance of the posterior cerebral arteries. No large vessel occlusion, hemodynamically significant stenosis, dissection, luminal irregularity, contrast extravasation or aneurysm. VENOUS SINUSES: Major dural venous sinuses are patent though not tailored for  evaluation on this angiographic examination. ANATOMIC VARIANTS: None. DELAYED PHASE: Not performed. IMPRESSION: Normal CTA NECK. Normal CTA HEAD. Electronically Signed   By: Awilda Metro M.D.   On: 11/17/2016 21:15   Ct Head Code Stroke W/o Cm  Result Date: 11/17/2016 CLINICAL DATA:  Code stroke. LEFT arm pain and numbness for 1 hour. Fever and body aches beginning yesterday. EXAM: CT HEAD WITHOUT CONTRAST TECHNIQUE: Contiguous axial images were obtained from the base of the skull through the vertex without intravenous contrast. COMPARISON:  None. FINDINGS: BRAIN: The ventricles and sulci are normal. No intraparenchymal hemorrhage, mass effect nor midline shift. No acute large vascular territory infarcts. No abnormal extra-axial fluid collections. Basal cisterns are patent. VASCULAR: Unremarkable. SKULL/SOFT TISSUES: No skull fracture. No significant soft tissue swelling. ORBITS/SINUSES: The included ocular globes and orbital contents are normal.Mild paranasal sinus mucosal thickening. Mastoid air cells are well aerated. OTHER: None. ASPECTS [facility] Stroke Program Early CT Score) - Ganglionic level infarction (caudate, lentiform nuclei, internal capsule, insula, M1-M3 cortex): 7 - Supraganglionic infarction (M4-M6 cortex): 3 Total score (0-10 with 10 being normal): 10 IMPRESSION: 1. Negative CT HEAD. 2. ASPECTS is 10. Critical Value/emergent results were called by telephone at the time of interpretation on 11/17/2016 at 6:26 pm to Dr. York Cerise, who verbally acknowledged these results. Electronically Signed   By: Awilda Metro M.D.   On: 11/17/2016 18:27    Assessment: 51 y.o. female presenting with headache and left hemiparesis.  Multiple functional components on neurological examination.  CT of the head reviewed and shows no acute changes.  CTA of head and neck unremarkable as well.  With continuation of headache and left sided weakness further work up recommended to rule out ischemic disease versus  infection.    Stroke Risk Factors - hypertension  Plan: 1.  MRI  of the brain without contrast 2.  If MR unremarkable, LP to be considered.   3.  PT/OT consults.   4.  Frequent neuro checks 5.  Telemetry   Thana Farr, MD Neurology [phone] 11/18/2016, 1:41 PM   Addendum: MRI, MRA and MRV reviewed.  No abnormalities noted.  Patient declines LP at this time.    Thana Farr, MD Neurology [phone]

## 2016-11-18 NOTE — Progress Notes (Signed)
OT Cancellation Note  Patient Details Name: Monica Day MRN: [id number] DOB: 03-24-1966   Cancelled Treatment:    Reason Eval/Treat Not Completed: Patient at procedure or test/ unavailable.  Order received and chart reviewed.  Pt at MRI and US.  Will attempt again later today.  Thank you for the referral.  Susanne BordersSusan Ahuva Poynor, OTR/L ascom [phone] 11/18/16, 11:59 AM

## 2016-11-18 NOTE — Evaluation (Signed)
Occupational Therapy Evaluation Patient Details Name: Monica NodalSarah Coburn Ralph MRN: [id number] DOB: July 16, 1966 Today's Date: 11/18/2016    History of Present Illness presented to ER secondary to numbness/tingling L UE, mild L facial droop; admitted for acute TIA/CVA work-up.   Clinical Impression   Pt is 51 year old female who presents with new onset of L UE numbness and tingling with mild L facial droop. CT and MRI and carotid assessments were all negative. Pt's facial droop is not apparent until she smiles.  Reviewed exercises to help with this as well as coordination exercises for L hand.  She has full AROM of LUE and hand with mild delay at times and inconsistencies noted during proprioception and light touch assessment.  She has numbness and tingling from L forearm to fingers on dorsal and ventral aspects.  She requires extra time to complete tasks using her L hand but with time was able to don and doff her socks sitting bedside.  She is able to complete toileting with supervision. Rec continued OT while in hospital to continue to work on increasing independence in ADLs, coordination in L hand and family ed and training.  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  Rec discussed with Steward DroneBrenda from [facility].    Follow Up Recommendations  Outpatient OT    Equipment Recommendations       Recommendations for Other Services       Precautions / Restrictions Precautions Precautions: Fall Restrictions Weight Bearing Restrictions: No      Mobility Bed Mobility Overal bed mobility: Independent                Transfers Overall transfer level: Modified independent Equipment used: None             General transfer comment: sit/stand without assist device, mod indep; good safety, stability and overall performance    Balance Overall balance assessment: Needs  assistance Sitting-balance support: No upper extremity supported;Feet supported Sitting balance-Leahy Scale: Normal     Standing balance support: No upper extremity supported Standing balance-Leahy Scale: Good                   Standardized Balance Assessment Standardized Balance Assessment : Berg Balance Test Berg Balance Test Sit to Stand: Able to stand without using hands and stabilize independently Standing Unsupported: Able to stand safely 2 minutes Sitting with Back Unsupported but Feet Supported on Floor or Stool: Able to sit safely and securely 2 minutes Stand to Sit: Sits safely with minimal use of hands Transfers: Able to transfer safely, minor use of hands Standing Unsupported with Eyes Closed: Able to stand 10 seconds with supervision Standing Ubsupported with Feet Together: Able to place feet together independently and stand for 1 minute with supervision From Standing, Reach Forward with Outstretched Arm: Can reach forward >12 cm safely (5") From Standing Position, Pick up Object from Floor: Able to pick up shoe safely and easily From Standing Position, Turn to Look Behind Over each Shoulder: Looks behind from both sides and weight shifts well Turn 360 Degrees: Able to turn 360 degrees safely one side only in 4 seconds or less Standing Unsupported, Alternately Place Feet on Step/Stool: Able to stand independently and complete 8 steps >20 seconds Standing Unsupported, One Foot in Front: Able to place foot tandem independently and hold 30 seconds Standing on One Leg: Able to lift leg  independently and hold 5-10 seconds Total Score: 50        ADL Overall ADL's : Needs assistance/impaired Eating/Feeding: Set up;Supervision/ safety Eating/Feeding Details (indicate cue type and reason): extra time to open containers and packages Grooming: Wash/dry hands;Wash/dry face;Oral care;Brushing hair;Set up           Upper Body Dressing : Minimal assistance;Set up Upper  Body Dressing Details (indicate cue type and reason): min assist for fine motor tasks only  Lower Body Dressing: Set up;Minimal assistance Lower Body Dressing Details (indicate cue type and reason): assist for fine motor tasks only and extra time to complete Toilet Transfer: Supervision/safety           Functional mobility during ADLs: Supervision/safety       Vision     Perception     Praxis      Pertinent Vitals/Pain Pain Assessment: 0-10 Pain Score: 4  Pain Location: head Pain Descriptors / Indicators: Headache;Heaviness Pain Intervention(s): Limited activity within patient's tolerance;Monitored during session     Hand Dominance Right   Extremity/Trunk Assessment Upper Extremity Assessment Upper Extremity Assessment: LUE deficits/detail LUE Deficits / Details: inconsistencies with light touch from forearm to hand with hesitation which she said was due to tingling in L hand up to forearm; intact proprioception; full active ROM of LUE and hand but decreased coordination during ADLs and needs extra time; mild drift but no pronation LUE Coordination: decreased fine motor   Lower Extremity Assessment Lower Extremity Assessment: Defer to PT evaluation   Cervical / Trunk Assessment Cervical / Trunk Assessment: Normal   Communication Communication Communication: No difficulties   Cognition Arousal/Alertness: Awake/alert Behavior During Therapy: WFL for tasks assessed/performed Overall Cognitive Status: Within Functional Limits for tasks assessed                     General Comments       Exercises       Shoulder Instructions      Home Living Family/patient expects to be discharged to:: Private residence Living Arrangements: Spouse/significant other Available Help at Discharge: Family Type of Home: House Home Access: Stairs to enter Secretary/administrator of Steps: 4 Entrance Stairs-Rails: None Home Layout: Two level;Bed/bath upstairs Alternate  Level Stairs-Number of Steps: full flight Alternate Level Stairs-Rails: Right Bathroom Shower/Tub: Tub/shower unit Shower/tub characteristics: Engineer, building services: Standard Bathroom Accessibility: Yes   Home Equipment: None          Prior Functioning/Environment Level of Independence: Independent        Comments: Indep with ADLs, household and community mobility; works full-time as Engineer, site        OT Problem List: Impaired UE functional use;Decreased coordination;Pain   OT Treatment/Interventions: Environmental manager;Therapeutic exercise;Patient/family education;Neuromuscular education    OT Goals(Current goals can be found in the care plan section) Acute Rehab OT Goals Patient Stated Goal: to return home soon OT Goal Formulation: With patient Time For Goal Achievement: 12/02/16 Potential to Achieve Goals: Good ADL Goals Pt Will Perform Upper Body Dressing: with supervision;standing Pt Will Perform Lower Body Dressing: with supervision;sit to/from stand Pt/caregiver will Perform Home Exercise Program: Left upper extremity;With written HEP provided;With theraputty  OT Frequency: Min 1X/week   Barriers to D/C:            Co-evaluation              End of Session    Activity Tolerance: Patient tolerated treatment well Patient left: in bed;with bed alarm set;with family/visitor present  Time: 1355-1425 OT Time Calculation (min): 30 min Charges:  OT General Charges $OT Visit: 1 Procedure OT Evaluation $OT Eval Low Complexity: 1 Procedure OT Treatments $Self Care/Home Management : 8-22 mins G-Codes: OT G-codes **NOT FOR INPATIENT CLASS** Functional Limitation: Self care Self Care Current Status (Z6109): At least 1 percent but less than 20 percent impaired, limited or restricted Self Care Goal Status (U0454): At least 1 percent but less than 20 percent impaired, limited or restricted   Susanne Borders, OTR/L ascom [phone] 11/18/16, 2:48  PM

## 2016-11-18 NOTE — ED Notes (Signed)
Transporting patient to room 113-1C 

## 2016-11-18 NOTE — Progress Notes (Signed)
Patient discharged home per MD order. Prescription given to patient. All discharge instructions given and all questions answered. 

## 2016-11-18 NOTE — Evaluation (Signed)
Physical Therapy Evaluation Patient Details Name: Monica Day MRN: [id number] DOB: 19-Apr-1966 Today's Date: 11/18/2016   History of Present Illness  presented to ER secondary to numbness/tingling L UE, mild L facial droop; admitted for acute TIA/CVA work-up.  Clinical Impression  Upon evaluation, patient alert and oriented; follows all commands and demonstrates good insight/safety awareness.  Strength and ROM assessment reveals mild strength, sensation and coordination in L UE; otherwise, all extremities grossly WFL.  Able to complete bed mobility indep; sit/stand, basic transfers and gait (220') without assist device, mod indep; stairs (up/down >12 with single rail), sup.  Mild higher-level balance deficits noted (in periods of SLS, narrowed/tandem BOS) with slight sway in trunk at times; BERG 50/56.  Patient with good awareness of deficits; good use of compensatory strategies as needed. Would benefit from skilled PT to address above deficits and promote optimal return to PLOF; recommend transition to home with outpatient PT to address higher level balance and core stability deficits.    Follow Up Recommendations Outpatient PT    Equipment Recommendations       Recommendations for Other Services       Precautions / Restrictions Precautions Precautions: Fall Restrictions Weight Bearing Restrictions: No      Mobility  Bed Mobility Overal bed mobility: Independent                Transfers Overall transfer level: Modified independent Equipment used: None             General transfer comment: sit/stand without assist device, mod indep; good safety, stability and overall performance  Ambulation/Gait Ambulation/Gait assistance: Modified independent (Device/Increase time) Ambulation Distance (Feet): 220 Feet Assistive device: None       General Gait Details: reciprocal stepping pattern with good step height/length, good trunk rotation  and arm sway.  No buckling or LOB.  Able to complete dynamic gait components, modulate gait speed wtihout difficulty.  Stairs Stairs: Yes Stairs assistance: Supervision Stair Management: One rail Right Number of Stairs: 14    Wheelchair Mobility    Modified Rankin (Stroke Patients Only)       Balance Overall balance assessment: Needs assistance Sitting-balance support: No upper extremity supported;Feet supported Sitting balance-Leahy Scale: Normal     Standing balance support: No upper extremity supported Standing balance-Leahy Scale: Good                   Standardized Balance Assessment Standardized Balance Assessment : Berg Balance Test Berg Balance Test Sit to Stand: Able to stand without using hands and stabilize independently Standing Unsupported: Able to stand safely 2 minutes Sitting with Back Unsupported but Feet Supported on Floor or Stool: Able to sit safely and securely 2 minutes Stand to Sit: Sits safely with minimal use of hands Transfers: Able to transfer safely, minor use of hands Standing Unsupported with Eyes Closed: Able to stand 10 seconds with supervision Standing Ubsupported with Feet Together: Able to place feet together independently and stand for 1 minute with supervision From Standing, Reach Forward with Outstretched Arm: Can reach forward >12 cm safely (5") From Standing Position, Pick up Object from Floor: Able to pick up shoe safely and easily From Standing Position, Turn to Look Behind Over each Shoulder: Looks behind from both sides and weight shifts well Turn 360 Degrees: Able to turn 360 degrees safely one side only in 4 seconds or less Standing Unsupported, Alternately Place Feet on Step/Stool: Able to stand independently and complete 8 steps >20 seconds Standing Unsupported, One  Foot in Front: Able to place foot tandem independently and hold 30 seconds Standing on One Leg: Able to lift leg independently and hold 5-10 seconds Total  Score: 50         Pertinent Vitals/Pain Pain Assessment: 0-10 Pain Score: 4  Pain Descriptors / Indicators: Headache;Heaviness Pain Intervention(s): Limited activity within patient's tolerance;Monitored during session;Repositioned    Home Living Family/patient expects to be discharged to:: Private residence Living Arrangements: Spouse/significant other Available Help at Discharge: Family Type of Home: House Home Access: Stairs to enter Entrance Stairs-Rails: None Entrance Stairs-Number of Steps: 4 Home Layout: Two level;Bed/bath upstairs        Prior Function Level of Independence: Independent         Comments: Indep with ADLs, household and community mobility; works full-time as Engineer, agriculturalschool teacher     Hand Dominance   Dominant Hand: Right    Extremity/Trunk Assessment   Upper Extremity Assessment Upper Extremity Assessment:  (decreased light touch, sensory awareness L UE; strength at least 4/5.  Mild decrease in speed of activation and coordination throughout L UE)    Lower Extremity Assessment Lower Extremity Assessment:  (grossly at least 4+ to 5/5 throughout; denies sensory deficit bilat LEs; clonus, babinski negative bilat)    Cervical / Trunk Assessment Cervical / Trunk Assessment: Normal  Communication   Communication: No difficulties  Cognition Arousal/Alertness: Awake/alert Behavior During Therapy: WFL for tasks assessed/performed Overall Cognitive Status: Within Functional Limits for tasks assessed                      General Comments      Exercises     Assessment/Plan    PT Assessment Patient needs continued PT services  PT Problem List Decreased balance;Decreased mobility;Decreased coordination          PT Treatment Interventions DME instruction;Gait training;Stair training;Functional mobility training;Therapeutic activities;Therapeutic exercise;Balance training;Patient/family education    PT Goals (Current goals can be found in  the Care Plan section)  Acute Rehab PT Goals Patient Stated Goal: to return home and not have to use that walker PT Goal Formulation: With patient/family Time For Goal Achievement: 12/02/16 Potential to Achieve Goals: Good    Frequency Min 2X/week   Barriers to discharge        Co-evaluation               End of Session Equipment Utilized During Treatment: Gait belt Activity Tolerance: Patient tolerated treatment well Patient left: in bed;with call bell/phone within reach;with family/visitor present      Functional Assessment Tool Used: clinical judgement, BERG assessment Functional Limitation: Mobility: Walking and moving around Mobility: Walking and Moving Around Current Status ([id number](G8978): At least 1 percent but less than 20 percent impaired, limited or restricted Mobility: Walking and Moving Around Goal Status [phone](G8979): 0 percent impaired, limited or restricted    Time: 1038-1100 PT Time Calculation (min) (ACUTE ONLY): 22 min   Charges:   PT Evaluation $PT Eval Low Complexity: 1 Procedure PT Treatments $Neuromuscular Re-education: 8-22 mins   PT G Codes:   PT G-Codes **NOT FOR INPATIENT CLASS** Functional Assessment Tool Used: clinical judgement, BERG assessment Functional Limitation: Mobility: Walking and moving around Mobility: Walking and Moving Around Current Status ([id number](G8978): At least 1 percent but less than 20 percent impaired, limited or restricted Mobility: Walking and Moving Around Goal Status [phone](G8979): 0 percent impaired, limited or restricted   Treatment session lead, directed by [employer] Quinton Shapcott, SPT; documented concurrently by Cephus SlaterKristen Brevin Mcfadden, PT.  Lilya Smitherman H. Manson Passey, PT, DPT, NCS 11/18/16, 11:16 AM [phone]

## 2016-11-19 LAB — HEMOGLOBIN A1C
HEMOGLOBIN A1C: 5.2 % (ref 4.8–5.6)
Hgb A1c MFr Bld: 5.2 % (ref 4.8–5.6)
MEAN PLASMA GLUCOSE: 103 mg/dL
Mean Plasma Glucose: 103 mg/dL

## 2016-12-03 NOTE — Discharge Summary (Signed)
[facility] [place]   PATIENT NAME: Monica Day    MR#:  [id number]  DATE OF BIRTH:  1966-03-06  DATE OF ADMISSION:  11/17/2016 ADMITTING PHYSICIAN: Jon GillsAlexis Hugelmeyer, DO  DATE OF DISCHARGE: 11/18/2016  5:00 PM  PRIMARY CARE PHYSICIAN: Pcp Not In System   ADMISSION DIAGNOSIS:  Acute CVA (cerebrovascular accident) (HCC) [I63.9]  DISCHARGE DIAGNOSIS:  Active Problems:   Left arm weakness   SECONDARY DIAGNOSIS:   Past Medical History:  Diagnosis Date  . Hypertension      ADMITTING HISTORY  HISTORY OF PRESENT ILLNESS: Monica Day is a 51 y.o. female with a known history of HTN, HLD, migraine was in a usual state of health until Yesterday when she developed flulike symptoms including fever to 100.5, body aches, headache, nasal congestion. She took some Motrin for her symptoms with no significant improvement. About 2:30 on the day of arrival to the emergency department about 4-5 hours prior to her arrival here she developed numbness and tingling of the left upper extremity. She is evaluated by urgent care and referred to the emergency department for objective left upper extremity weakness. She reports her symptoms have persisted and have not changed. Her husband notes that is about the same time that the left upper extremity no paresthesias started she did have a left-sided facial droop. Patient denied any perceived weakness, numbness or tingling in the left lower extremity. She denied speech difficulty swallowing difficulty, vision disturbance. At this time her only complaint is mild headache.  Of note patient reports that she had been diagnosed with hypertension several years ago for which she was taking medication however she lost 20 pounds and her hypertension improved. Recently her blood pressures have been in the 170s to 180s and she was started back on hydrochlorothiazide about 10 days ago. She has been monitoring her blood pressure at home and it  has been in the 130s to 140s systolic.  Otherwise there has been no change in status. Patient has been taking medication as prescribed and there has been no other recent change in medication or diet.  There has been no recent illness, travel or sick contacts.    Patient denies fevers/chills, weakness, dizziness, chest pain, shortness of breath, N/V/C/D, abdominal pain, dysuria/frequency, changes in mental status.   HOSPITAL COURSE:   * Left arm weakness Etiology remained unclear as MRI was normal. Weakness felt subjective on exam as per Dr. Thad Rangereynolds of neurology. LP was advised by her but patient did not want an LP. Afebrile. Normal vitals. Advised to follow up with neurology as OP. Not CVA.  Discharge home in stable condition. LP can be considered if patient wants further work up.  CONSULTS OBTAINED:  Treatment Team:  Thana FarrLeslie Reynolds, MD  DRUG ALLERGIES:  No Known Allergies  DISCHARGE MEDICATIONS:   Discharge Medication List as of 11/18/2016  4:09 PM    START taking these medications   Details  aspirin EC 81 MG EC tablet Take 1 tablet (81 mg total) by mouth daily., Starting Thu 11/19/2016, OTC      CONTINUE these medications which have NOT CHANGED   Details  Ascorbic Acid (VITAMIN C) 1000 MG tablet Take 1,000 mg by mouth daily., Historical Med    cholecalciferol (VITAMIN D) 400 units TABS tablet Take 400 Units by mouth., Historical Med    fluticasone (FLONASE) 50 MCG/ACT nasal spray Place 2 sprays into both nostrils daily., Starting Tue 10/27/2016, Normal    hydrochlorothiazide (HYDRODIURIL) 25 MG tablet  Take 25 mg by mouth daily., Historical Med    magnesium oxide (MAG-OX) 400 MG tablet Take 400 mg by mouth daily., Historical Med    Multiple Vitamin (MULTIVITAMIN) tablet Take 1 tablet by mouth daily., Historical Med        Today   VITAL SIGNS:  Blood pressure 125/74, pulse 73, temperature 97.9 F (36.6 C), temperature source Oral, resp. rate 18, height 5\' 8"   (1.727 m), weight 69.4 kg (153 lb 1.6 oz), SpO2 98 %.  I/O:  No intake or output data in the 24 hours ending 12/03/16 1423  PHYSICAL EXAMINATION:  Physical Exam  GENERAL:  51 y.o.-year-old patient lying in the bed with no acute distress.  LUNGS: Normal breath sounds bilaterally, no wheezing, rales,rhonchi or crepitation. No use of accessory muscles of respiration.  CARDIOVASCULAR: S1, S2 normal. No murmurs, rubs, or gallops.  ABDOMEN: Soft, non-tender, non-distended. Bowel sounds present. No organomegaly or mass.  NEUROLOGIC: Moves all 4 extremities. PSYCHIATRIC: The patient is alert and oriented x 3. Flat affect SKIN: No obvious rash, lesion, or ulcer.   DATA REVIEW:   CBC No results for input(s): WBC, HGB, HCT, PLT in the last 168 hours.  Chemistries  No results for input(s): NA, K, CL, CO2, GLUCOSE, BUN, CREATININE, CALCIUM, MG, AST, ALT, ALKPHOS, BILITOT in the last 168 hours.  Invalid input(s): GFRCGP  Cardiac Enzymes No results for input(s): TROPONINI in the last 168 hours.  Microbiology Results  Results for orders placed or performed in visit on 01/07/13  Urine culture     Status: None   Collection Time: 01/07/13 10:18 AM  Result Value Ref Range Status   Micro Text Report   Final       SOURCE: CLEAN CATCH URINE    ORGANISM 1                >100,000 CFU/ML ESCHERICHIA COLI   ORGANISM 2                60,000 CFU STREPTOCOCCUS VIRIDANS   COMMENT                   -   ANTIBIOTIC                    ORG#1     AMPICILLIN                    R         CEFAZOLIN                     S         CEFOXITIN                     S         CEFTRIAXONE                   S         CIPROFLOXACIN                 S         GENTAMICIN                    S         IMIPENEM                      S         LEVOFLOXACIN  S         NITROFURANTOIN                S         TRIMETHOPRIM/SULFAMETHOXAZOLE S             RADIOLOGY:  No results found.  Follow up with PCP in  1 week.  Management plans discussed with the patient, family and they are in agreement.  CODE STATUS:  Code Status History    Date Active Date Inactive Code Status Order ID Comments User Context   11/18/2016 12:46 AM 11/18/2016  8:05 PM Full Code [id number]  Tonye Royalty, DO Inpatient    Advance Directive Documentation   Flowsheet Row Most Recent Value  Type of Advance Directive  Healthcare Power of Attorney, Living will  Pre-existing out of facility DNR order (yellow form or pink MOST form)  No data  "MOST" Form in Place?  No data      TOTAL TIME TAKING CARE OF THIS PATIENT ON DAY OF DISCHARGE: more than 30 minutes.   Milagros Loll R M.D on 12/03/2016 at 2:23 PM  Between 7am to 6pm - Pager - [phone]  After 6pm go to [email or web address] - password [email or web address]  [facility]  Office  [phone]  CC: Primary care physician; Pcp Not In System  Note: This dictation was prepared with Dragon dictation along with smaller phrase technology. Any transcriptional errors that result from this process are unintentional.

## 2017-10-29 DIAGNOSIS — G44229 Chronic tension-type headache, not intractable: Secondary | ICD-10-CM | POA: Insufficient documentation

## 2018-04-10 ENCOUNTER — Other Ambulatory Visit: Payer: Self-pay

## 2018-04-10 ENCOUNTER — Ambulatory Visit
Admission: EM | Admit: 2018-04-10 | Discharge: 2018-04-10 | Disposition: A | Discharge status: Home / Self Care | Payer: BC Managed Care – PPO | Attending: Internal Medicine | Admitting: Internal Medicine

## 2018-04-10 DIAGNOSIS — R102 Pelvic and perineal pain: Secondary | ICD-10-CM | POA: Diagnosis not present

## 2018-04-10 DIAGNOSIS — N3000 Acute cystitis without hematuria: Secondary | ICD-10-CM | POA: Diagnosis not present

## 2018-04-10 LAB — URINALYSIS, COMPLETE (UACMP) WITH MICROSCOPIC
Bilirubin Urine: NEGATIVE
Glucose, UA: NEGATIVE mg/dL
Nitrite: POSITIVE — AB
PH: 7 (ref 5.0–8.0)
Protein, ur: 30 mg/dL — AB
SPECIFIC GRAVITY, URINE: 1.015 (ref 1.005–1.030)
WBC, UA: >50 WBC/hpf (ref 0–5)

## 2018-04-10 MED ORDER — CEPHALEXIN 250 MG PO CAPS: 250.0000 mg | ORAL_CAPSULE | Freq: Three times a day (TID) | ORAL | 0 refills | Status: DC

## 2018-04-10 NOTE — ED Provider Notes (Signed)
[facility]    CSN: [id number] Arrival date & time: 04/10/18  1025     History   Chief Complaint Chief Complaint  Patient presents with  . Pelvic Pain  . Dysuria    HPI Monica Day is a 52 y.o. female.   The patient with past medical history of stroke presents to urgent care complaining of pelvic fullness and occasional pain along with dysuria and vaginal pain.  She reports that she sustained a small laceration when she was intimate with her husband 3 days ago.  She denies fevers, nausea, vomiting or diarrhea.  She also denies vaginal discharge     Past Medical History:  Diagnosis Date  . Hypertension     Patient Active Problem List   Diagnosis Date Noted  . Left arm weakness 11/18/2016    Past Surgical History:  Procedure Laterality Date  . ABDOMINAL HYSTERECTOMY  01/2011  . HIP SURGERY Right 02/2016   Reconstruction  . NASAL SINUS SURGERY      OB History   None      Home Medications    Prior to Admission medications   Medication Sig Start Date End Date Taking? Authorizing Provider  Ascorbic Acid (VITAMIN C) 1000 MG tablet Take 1,000 mg by mouth daily.    [provider]  aspirin EC 81 MG EC tablet Take 1 tablet (81 mg total) by mouth daily. 11/19/16   Milagros LollSudini, Srikar, MD  cephALEXin (KEFLEX) 250 MG capsule Take 1 capsule (250 mg total) by mouth 3 (three) times daily. 04/10/18   Arnaldo Nataliamond, Samra Pesch S, MD  cholecalciferol (VITAMIN D) 400 units TABS tablet Take 400 Units by mouth.    [provider]  fluticasone (FLONASE) 50 MCG/ACT nasal spray Place 2 sprays into both nostrils daily. 10/27/16   Hassan RowanWade, Eugene, MD  hydrochlorothiazide (HYDRODIURIL) 25 MG tablet Take 25 mg by mouth daily.    [provider]  magnesium oxide (MAG-OX) 400 MG tablet Take 400 mg by mouth daily.    [provider]  Multiple Vitamin (MULTIVITAMIN) tablet Take 1 tablet by mouth daily.    [provider]    Family  History Family History  Problem Relation Age of Onset  . Lung cancer Father   . Prostate cancer Father   . Hypertension Brother   . Hypertension Brother     Social History Social History   Tobacco Use  . Smoking status: Never Smoker  . Smokeless tobacco: Never Used  Substance Use Topics  . Alcohol use: Yes    Comment: occasionally  . Drug use: No     Allergies   Patient has no known allergies.   Review of Systems Review of Systems  Constitutional: Negative for chills and fever.  HENT: Negative for sore throat and tinnitus.   Eyes: Negative for redness.  Respiratory: Negative for cough and shortness of breath.   Cardiovascular: Negative for chest pain and palpitations.  Gastrointestinal: Negative for abdominal pain, diarrhea, nausea and vomiting.  Genitourinary: Positive for dysuria and vaginal pain. Negative for frequency and urgency.  Musculoskeletal: Negative for myalgias.  Skin: Negative for rash.       No lesions  Neurological: Negative for weakness.  Hematological: Does not bruise/bleed easily.  Psychiatric/Behavioral: Negative for suicidal ideas.     Physical Exam Triage Vital Signs ED Triage Vitals  Enc Vitals Group     BP 04/10/18 1032 107/86     Pulse Rate 04/10/18 1032 77     Resp 04/10/18  1032 16     Temp 04/10/18 1032 98.4 F (36.9 C)     Temp Source 04/10/18 1032 Oral     SpO2 04/10/18 1032 100 %     Weight 04/10/18 1034 142 lb (64.4 kg)     Height 04/10/18 1034 5\' 8"  (1.727 m)     Head Circumference --      Peak Flow --      Pain Score 04/10/18 1034 1     Pain Loc --      Pain Edu? --      Excl. in GC? --    No data found.  Updated Vital Signs BP 107/86 (BP Location: Left Arm)   Pulse 77   Temp 98.4 F (36.9 C) (Oral)   Resp 16   Ht 5\' 8"  (1.727 m)   Wt 142 lb (64.4 kg)   SpO2 100%   BMI 21.59 kg/m   Visual Acuity Right Eye Distance:   Left Eye Distance:   Bilateral Distance:    Right Eye Near:   Left Eye Near:     Bilateral Near:     Physical Exam  Constitutional: She is oriented to person, place, and time. She appears well-developed and well-nourished. No distress.  HENT:  Head: Normocephalic and atraumatic.  Mouth/Throat: Oropharynx is clear and moist.  Eyes: Pupils are equal, round, and reactive to light. Conjunctivae and EOM are normal. No scleral icterus.  Neck: Normal range of motion. Neck supple. No JVD present. No tracheal deviation present. No thyromegaly present.  Cardiovascular: Normal rate, regular rhythm and normal heart sounds. Exam reveals no gallop and no friction rub.  No murmur heard. Pulmonary/Chest: Effort normal and breath sounds normal.  Abdominal: Soft. Bowel sounds are normal. She exhibits no distension. There is no tenderness.  Musculoskeletal: Normal range of motion. She exhibits no edema.  Lymphadenopathy:    She has no cervical adenopathy.  Neurological: She is alert and oriented to person, place, and time. No cranial nerve deficit.  Skin: Skin is warm and dry.  Psychiatric: She has a normal mood and affect. Her behavior is normal. Judgment and thought content normal.  Nursing note and vitals reviewed.    UC Treatments / Results  Labs (all labs ordered are listed, but only abnormal results are displayed) Labs Reviewed  URINALYSIS, COMPLETE (UACMP) WITH MICROSCOPIC - Abnormal; Notable for the following components:      Result Value   APPearance CLOUDY (*)    Hgb urine dipstick MODERATE (*)    Ketones, ur TRACE (*)    Protein, ur 30 (*)    Nitrite POSITIVE (*)    Leukocytes, UA LARGE (*)    Bacteria, UA MANY (*)    All other components within normal limits    EKG None  Radiology No results found.  Procedures Procedures (including critical care time)  Medications Ordered in UC Medications - No data to display  Initial Impression / Assessment and Plan / UC Course  I have reviewed the triage vital signs and the nursing notes.  Pertinent labs &  imaging results that were available during my care of the patient were reviewed by me and considered in my medical decision making (see chart for details).     UA positive for UTI.  No signs or symptoms of sepsis.  No concern for sexually transmitted disease.  Patient denies avulsed tissue of the vulva.  Final Clinical Impressions(s) / UC Diagnoses   Final diagnoses:  Acute cystitis without hematuria  Discharge Instructions   None    ED Prescriptions    Medication Sig Dispense Auth. Provider   cephALEXin (KEFLEX) 250 MG capsule Take 1 capsule (250 mg total) by mouth 3 (three) times daily. 21 capsule Arnaldo Natal, MD     Controlled Substance Prescriptions [place] Controlled Substance Registry consulted? Not Applicable   Arnaldo Natal, MD 04/10/18 1155

## 2018-04-10 NOTE — ED Triage Notes (Signed)
Pt with questionable urinary symptoms described as a "twinge" when she urinates. Low pelvic pain 2/10. Also states her husband's fingernail scratched her vagina and she has been sore from that

## 2018-04-26 ENCOUNTER — Ambulatory Visit
Admission: EM | Admit: 2018-04-26 | Discharge: 2018-04-26 | Disposition: A | Discharge status: Home / Self Care | Payer: BC Managed Care – PPO | Attending: Family Medicine | Admitting: Family Medicine

## 2018-04-26 ENCOUNTER — Encounter: Payer: Self-pay | Admitting: Emergency Medicine

## 2018-04-26 ENCOUNTER — Other Ambulatory Visit: Payer: Self-pay

## 2018-04-26 DIAGNOSIS — R1030 Lower abdominal pain, unspecified: Secondary | ICD-10-CM | POA: Diagnosis not present

## 2018-04-26 DIAGNOSIS — N3001 Acute cystitis with hematuria: Secondary | ICD-10-CM

## 2018-04-26 DIAGNOSIS — R42 Dizziness and giddiness: Secondary | ICD-10-CM

## 2018-04-26 LAB — URINALYSIS, COMPLETE (UACMP) WITH MICROSCOPIC
Bilirubin Urine: NEGATIVE
GLUCOSE, UA: NEGATIVE mg/dL
Ketones, ur: NEGATIVE mg/dL
NITRITE: NEGATIVE
PH: 5.5 (ref 5.0–8.0)
Protein, ur: NEGATIVE mg/dL

## 2018-04-26 MED ORDER — NITROFURANTOIN MONOHYD MACRO 100 MG PO CAPS: 100.0000 mg | ORAL_CAPSULE | Freq: Two times a day (BID) | ORAL | 0 refills | Status: DC

## 2018-04-26 MED ORDER — PHENAZOPYRIDINE HCL 200 MG PO TABS: 200.0000 mg | ORAL_TABLET | Freq: Three times a day (TID) | ORAL | 0 refills | Status: DC

## 2018-04-26 MED ORDER — FLUCONAZOLE 150 MG PO TABS: 150.0000 mg | ORAL_TABLET | Freq: Once | ORAL | 0 refills | Status: AC

## 2018-04-26 NOTE — ED Triage Notes (Signed)
Patient c/o burning when urinating and suprapubic pain that started this morning.  Patient previously had UTI 2 weeks ago.

## 2018-04-26 NOTE — ED Provider Notes (Signed)
[facility]    CSN: [id number] Arrival date & time: 04/26/18  1627  History   Chief Complaint Chief Complaint  Patient presents with  . Dysuria   HPI  52 year old female presents with dysuria.  Patient reports that she was recently diagnosed and treated for urinary tract infection with Keflex.  She improved.  Symptoms recurred this morning.  She reports severe dysuria.  She reports that it is very uncomfortable.  Suprapubic pain and dizziness as well.  No fever.  No back pain or flank pain.  No known exacerbating or relieving factors.  No other complaints.  Past Medical History:  Diagnosis Date  . Hypertension    Patient Active Problem List   Diagnosis Date Noted  . Left arm weakness 11/18/2016   Past Surgical History:  Procedure Laterality Date  . ABDOMINAL HYSTERECTOMY  01/2011  . HIP SURGERY Right 02/2016   Reconstruction  . NASAL SINUS SURGERY     OB History   None    Home Medications    Prior to Admission medications   Medication Sig Start Date End Date Taking? Authorizing Provider  Ascorbic Acid (VITAMIN C) 1000 MG tablet Take 1,000 mg by mouth daily.   Yes [provider]  aspirin EC 81 MG EC tablet Take 1 tablet (81 mg total) by mouth daily. 11/19/16  Yes Sudini, Wardell Heath, MD  cholecalciferol (VITAMIN D) 400 units TABS tablet Take 400 Units by mouth.   Yes [provider]  fluticasone (FLONASE) 50 MCG/ACT nasal spray Place 2 sprays into both nostrils daily. 10/27/16  Yes Hassan Rowan, MD  hydrochlorothiazide (HYDRODIURIL) 25 MG tablet Take 25 mg by mouth daily.   Yes [provider]  magnesium oxide (MAG-OX) 400 MG tablet Take 400 mg by mouth daily.   Yes [provider]  Multiple Vitamin (MULTIVITAMIN) tablet Take 1 tablet by mouth daily.   Yes [provider]  fluconazole (DIFLUCAN) 150 MG tablet Take 1 tablet (150 mg total) by mouth once for 1 dose. Repeat dose in 72 hours. 04/26/18 04/26/18  Tommie Sams, DO    nitrofurantoin, macrocrystal-monohydrate, (MACROBID) 100 MG capsule Take 1 capsule (100 mg total) by mouth 2 (two) times daily. 04/26/18   Tommie Sams, DO  phenazopyridine (PYRIDIUM) 200 MG tablet Take 1 tablet (200 mg total) by mouth 3 (three) times daily. 04/26/18   Tommie Sams, DO   Family History Family History  Problem Relation Age of Onset  . Lung cancer Father   . Prostate cancer Father   . Hypertension Brother   . Hypertension Brother    Social History Social History   Tobacco Use  . Smoking status: Never Smoker  . Smokeless tobacco: Never Used  Substance Use Topics  . Alcohol use: Yes    Comment: occasionally  . Drug use: No   Allergies   Patient has no known allergies.  Review of Systems Review of Systems  Constitutional: Negative.   Gastrointestinal: Positive for abdominal pain.  Genitourinary: Positive for dysuria.  Neurological: Positive for dizziness.   Physical Exam Triage Vital Signs ED Triage Vitals  Enc Vitals Group     BP 04/26/18 1639 (!) 135/95     Pulse Rate 04/26/18 1639 94     Resp 04/26/18 1639 16     Temp 04/26/18 1639 98.6 F (37 C)     Temp Source 04/26/18 1639 Oral     SpO2 04/26/18 1639 100 %     Weight 04/26/18  1636 140 lb (63.5 kg)     Height 04/26/18 1636 5\' 8"  (1.727 m)     Head Circumference --      Peak Flow --      Pain Score 04/26/18 1636 5     Pain Loc --      Pain Edu? --      Excl. in GC? --    Updated Vital Signs BP (!) 135/95 (BP Location: Left Arm)   Pulse 94   Temp 98.6 F (37 C) (Oral)   Resp 16   Ht 5\' 8"  (1.727 m)   Wt 140 lb (63.5 kg)   SpO2 100%   BMI 21.29 kg/m   Physical Exam  Constitutional: She is oriented to person, place, and time. She appears well-developed. No distress.  Cardiovascular: Normal rate and regular rhythm.  Pulmonary/Chest: Effort normal and breath sounds normal. She has no wheezes. She has no rales.  Abdominal: Soft. She exhibits no distension.  Mild suprapubic tenderness to  palpation. No CVA tenderness.  Neurological: She is alert and oriented to person, place, and time.  Psychiatric: She has a normal mood and affect. Her behavior is normal.  Nursing note and vitals reviewed.  UC Treatments / Results  Labs (all labs ordered are listed, but only abnormal results are displayed) Labs Reviewed  URINALYSIS, COMPLETE (UACMP) WITH MICROSCOPIC - Abnormal; Notable for the following components:      Result Value   APPearance CLOUDY (*)    Specific Gravity, Urine >1.030 (*)    Hgb urine dipstick TRACE (*)    Leukocytes, UA SMALL (*)    Bacteria, UA FEW (*)    All other components within normal limits  URINE CULTURE    EKG None  Radiology No results found.  Procedures Procedures (including critical care time)  Medications Ordered in UC Medications - No data to display  Initial Impression / Assessment and Plan / UC Course  I have reviewed the triage vital signs and the nursing notes.  Pertinent labs & imaging results that were available during my care of the patient were reviewed by me and considered in my medical decision making (see chart for details).    52 year old female presents with UTI.  Sending culture.  Placing on Macrobid.  Pyridium for comfort.  Diflucan if needed.  Final Clinical Impressions(s) / UC Diagnoses   Final diagnoses:  Acute cystitis with hematuria     Discharge Instructions     Medications as prescribed.  If something needs to be changed with the culture results you will get a call.  Take care  Dr. Adriana Simasook    ED Prescriptions    Medication Sig Dispense Auth. Provider   nitrofurantoin, macrocrystal-monohydrate, (MACROBID) 100 MG capsule Take 1 capsule (100 mg total) by mouth 2 (two) times daily. 14 capsule Maxamillion Banas G, DO   fluconazole (DIFLUCAN) 150 MG tablet Take 1 tablet (150 mg total) by mouth once for 1 dose. Repeat dose in 72 hours. 2 tablet Ronelle Smallman G, DO   phenazopyridine (PYRIDIUM) 200 MG tablet Take 1  tablet (200 mg total) by mouth 3 (three) times daily. 6 tablet Tommie Samsook, Lenna Hagarty G, DO     Controlled Substance Prescriptions [place] Controlled Substance Registry consulted? Not Applicable   Tommie SamsCook, Kaysee Hergert G, DO 04/26/18 1721

## 2018-04-26 NOTE — Discharge Instructions (Signed)
Medications as prescribed.  If something needs to be changed with the culture results you will get a call.  Take care  Dr. Adriana Simasook

## 2018-04-28 LAB — URINE CULTURE

## 2018-05-13 ENCOUNTER — Ambulatory Visit
Admission: EM | Admit: 2018-05-13 | Discharge: 2018-05-13 | Disposition: A | Discharge status: Home / Self Care | Payer: BC Managed Care – PPO | Attending: Family Medicine | Admitting: Family Medicine

## 2018-05-13 ENCOUNTER — Other Ambulatory Visit: Payer: Self-pay

## 2018-05-13 DIAGNOSIS — R310 Gross hematuria: Secondary | ICD-10-CM

## 2018-05-13 DIAGNOSIS — N3091 Cystitis, unspecified with hematuria: Secondary | ICD-10-CM | POA: Diagnosis not present

## 2018-05-13 LAB — URINALYSIS, COMPLETE (UACMP) WITH MICROSCOPIC
Bacteria, UA: NONE SEEN
Glucose, UA: NEGATIVE mg/dL
Nitrite: NEGATIVE
Protein, ur: >300 mg/dL — AB
SPECIFIC GRAVITY, URINE: 1.02 (ref 1.005–1.030)
Squamous Epithelial / LPF: NONE SEEN (ref 0–5)
WBC UA: NONE SEEN WBC/hpf (ref 0–5)
pH: 7.5 (ref 5.0–8.0)

## 2018-05-13 LAB — WET PREP, GENITAL
Clue Cells Wet Prep HPF POC: NONE SEEN
Sperm: NONE SEEN
Trich, Wet Prep: NONE SEEN
Yeast Wet Prep HPF POC: NONE SEEN

## 2018-05-13 LAB — CHLAMYDIA/NGC RT PCR (ARMC ONLY)
Chlamydia Tr: NOT DETECTED
N gonorrhoeae: NOT DETECTED

## 2018-05-13 MED ORDER — SULFAMETHOXAZOLE-TRIMETHOPRIM 800-160 MG PO TABS: 1.0000 | ORAL_TABLET | Freq: Two times a day (BID) | ORAL | 0 refills | Status: DC

## 2018-05-13 MED ORDER — PHENAZOPYRIDINE HCL 200 MG PO TABS: 200.0000 mg | ORAL_TABLET | Freq: Three times a day (TID) | ORAL | 0 refills | Status: DC

## 2018-05-13 MED ORDER — NITROFURANTOIN MONOHYD MACRO 100 MG PO CAPS: ORAL_CAPSULE | ORAL | 1 refills | Status: DC

## 2018-05-13 NOTE — ED Provider Notes (Signed)
[facility]    CSN: [id number] Arrival date & time: 05/13/18  0808     History   Chief Complaint Chief Complaint  Patient presents with  . Hematuria    HPI Monica Day is a 52 y.o. female.   HPI  52 year old female who presents with onset of 2 days history hematuria malodorous urine and frequency.  Tingling burning pain over suprapubic area and severe dysuria.  She is in a new relationship for the last 3 months that is monogamous and after researching "Dr. Waverly Ferrari" felt that she possibly has honeymoon cystitis since she has been with the partner for the last week.  Had no vaginal discharge.Denies  Fever chills.  Her medical records shows that she was treated on 04/10/2018 and again 7-19 for acute cystitis with hematuria she started the relationship with her current boyfriend April 2019.  To her knowledge he is monogamous as well.  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        Past Medical History:  Diagnosis Date  . Hypertension     Patient Active Problem List   Diagnosis Date Noted  . Left arm weakness 11/18/2016    Past Surgical History:  Procedure Laterality Date  . ABDOMINAL HYSTERECTOMY  01/2011  . HIP SURGERY Right 02/2016   Reconstruction  . NASAL SINUS SURGERY      OB History   None      Home Medications    Prior to Admission medications   Medication Sig Start Date End Date Taking? Authorizing Provider  Ascorbic Acid (VITAMIN C) 1000 MG tablet Take 1,000 mg by mouth daily.   Yes [provider]  aspirin EC 81 MG EC tablet Take 1 tablet (81 mg total) by mouth daily. 11/19/16  Yes Sudini, Wardell Heath, MD  cholecalciferol (VITAMIN D) 400 units TABS tablet Take 400 Units by mouth.   Yes [provider]  fluticasone (FLONASE) 50 MCG/ACT nasal spray Place 2 sprays into both nostrils daily. 10/27/16  Yes Hassan Rowan, MD  hydrochlorothiazide (HYDRODIURIL)  25 MG tablet Take 25 mg by mouth daily.   Yes [provider]  magnesium oxide (MAG-OX) 400 MG tablet Take 400 mg by mouth daily.   Yes [provider]  Multiple Vitamin (MULTIVITAMIN) tablet Take 1 tablet by mouth daily.   Yes [provider]  nitrofurantoin, macrocrystal-monohydrate, (MACROBID) 100 MG capsule Take 1 tablet by mouth as directed. 05/13/18   Lutricia Feil, PA-C  phenazopyridine (PYRIDIUM) 200 MG tablet Take 1 tablet (200 mg total) by mouth 3 (three) times daily. 05/13/18   Lutricia Feil, PA-C  sulfamethoxazole-trimethoprim (BACTRIM DS,SEPTRA DS) 800-160 MG tablet Take 1 tablet by mouth 2 (two) times daily. 05/13/18   Lutricia Feil, PA-C    Family History Family History  Problem Relation Age of Onset  . Lung cancer Father   . Prostate cancer Father   . Hypertension Brother   . Hypertension Brother     Social History Social History   Tobacco Use  . Smoking status: Never Smoker  . Smokeless tobacco: Never Used  Substance Use Topics  . Alcohol use: Yes    Comment: occasionally  . Drug use: No     Allergies   Patient has no known allergies.   Review of Systems Review of Systems  Constitutional: Positive for activity change. Negative for chills, fatigue and fever.  Genitourinary: Positive for dysuria,  frequency, hematuria and urgency. Negative for vaginal bleeding and vaginal discharge.  All other systems reviewed and are negative.    Physical Exam Triage Vital Signs ED Triage Vitals  Enc Vitals Group     BP 05/13/18 0826 (!) 135/100     Pulse Rate 05/13/18 0826 90     Resp 05/13/18 0826 18     Temp 05/13/18 0826 98.6 F (37 C)     Temp Source 05/13/18 0826 Oral     SpO2 05/13/18 0826 100 %     Weight 05/13/18 0824 140 lb (63.5 kg)     Height 05/13/18 0824 5\' 8"  (1.727 m)     Head Circumference --      Peak Flow --      Pain Score 05/13/18 0824 5     Pain Loc --      Pain Edu? --      Excl. in GC? --    No  data found.  Updated Vital Signs BP (!) 135/100 (BP Location: Right Arm)   Pulse 90   Temp 98.6 F (37 C) (Oral)   Resp 18   Ht 5\' 8"  (1.727 m)   Wt 140 lb (63.5 kg)   SpO2 100%   BMI 21.29 kg/m   Visual Acuity Right Eye Distance:   Left Eye Distance:   Bilateral Distance:    Right Eye Near:   Left Eye Near:    Bilateral Near:     Physical Exam  Constitutional: She is oriented to person, place, and time. She appears well-developed and well-nourished. No distress.  HENT:  Head: Normocephalic.  Eyes: Pupils are equal, round, and reactive to light.  Neck: Normal range of motion.  Genitourinary:  Genitourinary Comments:  Patient Performed a self swab  Musculoskeletal: Normal range of motion.  Neurological: She is alert and oriented to person, place, and time.  Skin: Skin is warm and dry. She is not diaphoretic.  Psychiatric: She has a normal mood and affect. Her behavior is normal. Judgment and thought content normal.  Nursing note and vitals reviewed.    UC Treatments / Results  Labs (all labs ordered are listed, but only abnormal results are displayed) Labs Reviewed  WET PREP, GENITAL - Abnormal; Notable for the following components:      Result Value   WBC, Wet Prep HPF POC RARE (*)    All other components within normal limits  URINALYSIS, COMPLETE (UACMP) WITH MICROSCOPIC - Abnormal; Notable for the following components:   Color, Urine RED (*)    APPearance CLOUDY (*)    Hgb urine dipstick LARGE (*)    Bilirubin Urine SMALL (*)    Ketones, ur TRACE (*)    Protein, ur >300 (*)    Leukocytes, UA MODERATE (*)    All other components within normal limits  URINE CULTURE  CHLAMYDIA/NGC RT PCR ([facility] ONLY)    EKG None  Radiology No results found.  Procedures Procedures (including critical care time)  Medications Ordered in UC Medications - No data to display  Initial Impression / Assessment and Plan / UC Course  I have reviewed the triage  vital signs and the nursing notes.  Pertinent labs & imaging results that were available during my care of the patient were reviewed by me and considered in my medical decision making (see chart for details).     Plan: 1. Test/x-ray results and diagnosis reviewed with patient 2. rx as per orders; risks, benefits, potential side effects reviewed with  patient 3. Recommend supportive treatment with creasing fluid intake.  Treat Currently with antibiotics for cystitis with hematuria.  Because of the recurrence will make it a complicated UTI protocol.  Place her on Septra DS for 7 days.  Likely that she has post coital UTIs.  I will start her on Macrobid 100 mg to be used after sexual intercourse as soon as possible afterwards.  If she is not improving she should follow-up with a GYN or urologist.  Urine culture will be available in 48 hours. 4. F/u prn if symptoms worsen or don't improve  Final Clinical Impressions(s) / UC Diagnoses   Final diagnoses:  Gross hematuria  Hemorrhagic cystitis   Discharge Instructions   None    ED Prescriptions    Medication Sig Dispense Auth. Provider   sulfamethoxazole-trimethoprim (BACTRIM DS,SEPTRA DS) 800-160 MG tablet Take 1 tablet by mouth 2 (two) times daily. 14 tablet Ovid Curdoemer, Eddy Liszewski P, PA-C   phenazopyridine (PYRIDIUM) 200 MG tablet Take 1 tablet (200 mg total) by mouth 3 (three) times daily. 6 tablet Ovid Curdoemer, Elfa Wooton P, PA-C   nitrofurantoin, macrocrystal-monohydrate, (MACROBID) 100 MG capsule Take 1 tablet by mouth as directed. 30 capsule Lutricia Feiloemer, Suyash Amory P, PA-C     Controlled Substance Prescriptions [place] Controlled Substance Registry consulted? Not Applicable   Lutricia FeilRoemer, Brianna Esson P, PA-C 05/13/18 1723

## 2018-05-13 NOTE — ED Triage Notes (Signed)
Patient complains of hematuria, malodorous urine, urinary frequency x 2 days.

## 2018-05-15 LAB — URINE CULTURE: Culture: >=100000 — AB

## 2018-09-04 ENCOUNTER — Emergency Department
Admission: EM | Admit: 2018-09-04 | Discharge: 2018-09-04 | Disposition: A | Discharge status: Home / Self Care | Payer: BC Managed Care – PPO | Attending: Emergency Medicine | Admitting: Emergency Medicine

## 2018-09-04 ENCOUNTER — Emergency Department: Payer: BC Managed Care – PPO

## 2018-09-04 DIAGNOSIS — R51 Headache: Secondary | ICD-10-CM | POA: Insufficient documentation

## 2018-09-04 DIAGNOSIS — I10 Essential (primary) hypertension: Secondary | ICD-10-CM | POA: Diagnosis not present

## 2018-09-04 DIAGNOSIS — R519 Headache, unspecified: Secondary | ICD-10-CM

## 2018-09-04 DIAGNOSIS — Z79899 Other long term (current) drug therapy: Secondary | ICD-10-CM | POA: Insufficient documentation

## 2018-09-04 LAB — BASIC METABOLIC PANEL
ANION GAP: 6 (ref 5–15)
BUN: 10 mg/dL (ref 6–20)
CALCIUM: 8.9 mg/dL (ref 8.9–10.3)
CO2: 31 mmol/L (ref 22–32)
CREATININE: 0.73 mg/dL (ref 0.44–1.00)
Chloride: 102 mmol/L (ref 98–111)
GFR calc Af Amer: >60 mL/min (ref >60)
GFR calc non Af Amer: >60 mL/min (ref >60)
GLUCOSE: 108 mg/dL — AB (ref 70–99)
Potassium: 3.5 mmol/L (ref 3.5–5.1)
Sodium: 139 mmol/L (ref 135–145)

## 2018-09-04 LAB — CBC WITH DIFFERENTIAL/PLATELET
Abs Immature Granulocytes: 0.03 10*3/uL (ref 0.00–0.07)
Basophils Absolute: 0 10*3/uL (ref 0.0–0.1)
Basophils Relative: 0 %
EOS ABS: 0.1 10*3/uL (ref 0.0–0.5)
Eosinophils Relative: 1 %
HEMATOCRIT: 42.2 % (ref 36.0–46.0)
HEMOGLOBIN: 14.1 g/dL (ref 12.0–15.0)
Immature Granulocytes: 0 %
LYMPHS ABS: 1.1 10*3/uL (ref 0.7–4.0)
Lymphocytes Relative: 17 %
MCH: 31.2 pg (ref 26.0–34.0)
MCHC: 33.4 g/dL (ref 30.0–36.0)
MCV: 93.4 fL (ref 80.0–100.0)
MONOS PCT: 11 %
Monocytes Absolute: 0.7 10*3/uL (ref 0.1–1.0)
NEUTROS ABS: 4.8 10*3/uL (ref 1.7–7.7)
NEUTROS PCT: 71 %
Platelets: 186 10*3/uL (ref 150–400)
RBC: 4.52 MIL/uL (ref 3.87–5.11)
RDW: 11.8 % (ref 11.5–15.5)
WBC: 6.8 10*3/uL (ref 4.0–10.5)
nRBC: 0 % (ref 0.0–0.2)

## 2018-09-04 MED ORDER — DIPHENHYDRAMINE HCL 50 MG/ML IJ SOLN
25.0000 mg | Freq: Once | INTRAMUSCULAR | Status: AC
  Administered 2018-09-04: 25 mg via INTRAVENOUS
  Filled 2018-09-04: qty 1

## 2018-09-04 MED ORDER — SODIUM CHLORIDE 0.9 % IV BOLUS
1000.0000 mL | Freq: Once | INTRAVENOUS | Status: AC
  Administered 2018-09-04: 1000 mL via INTRAVENOUS

## 2018-09-04 MED ORDER — KETOROLAC TROMETHAMINE 30 MG/ML IJ SOLN
30.0000 mg | Freq: Once | INTRAMUSCULAR | Status: AC
  Administered 2018-09-04: 30 mg via INTRAVENOUS
  Filled 2018-09-04: qty 1

## 2018-09-04 MED ORDER — PROCHLORPERAZINE EDISYLATE 10 MG/2ML IJ SOLN
10.0000 mg | Freq: Once | INTRAMUSCULAR | Status: AC
  Administered 2018-09-04: 10 mg via INTRAVENOUS
  Filled 2018-09-04: qty 2

## 2018-09-04 MED ORDER — BUTALBITAL-APAP-CAFFEINE 50-325-40 MG PO TABS: 1.0000 | ORAL_TABLET | Freq: Four times a day (QID) | ORAL | 0 refills | Status: AC | PRN

## 2018-09-04 NOTE — ED Notes (Signed)
Patient transported to CT 

## 2018-09-04 NOTE — ED Triage Notes (Signed)
Pt presents via POV c/o headache x3 days. Reports headache for two days earlier in the week, denies trauma. + nausea.

## 2018-09-04 NOTE — ED Notes (Signed)
NAD noted at time of D/C. Pt denies questions or concerns. Pt ambulatory to the lobby at this time.  

## 2018-09-04 NOTE — ED Provider Notes (Signed)
[facility] Emergency Department Provider Note  ___________________________________________   First MD Initiated Contact with Patient 09/04/18 1158     (approximate)  I have reviewed the triage vital signs and the nursing notes.   HISTORY  Chief Complaint Headache   HPI Monica Day is a 52 y.o. female a history of hypertension was presented to the emergency department with 3 days of headache.  Says that the headache feels like a band that is tightening from her temples all the way around the back of her skull to the base of her skull.  She states that it is associated with nausea and is a 7 out of 10 at this time.  Says that it woke her from sleep 3 nights ago but is been waxing and waning since.  Denies photophobia.  Says that she also had a similar headache earlier in the week that lasted for 2 days that resolved spontaneously.  Patient does not have any history in the family of aneurysm that she is aware of.  Has tried ibuprofen as well as Flexeril at home without relief.  Says she does not have any history of migraine headache.  Past Medical History:  Diagnosis Date  . Hypertension     Patient Active Problem List   Diagnosis Date Noted  . Left arm weakness 11/18/2016    Past Surgical History:  Procedure Laterality Date  . ABDOMINAL HYSTERECTOMY  01/2011  . HIP SURGERY Right 02/2016   Reconstruction  . NASAL SINUS SURGERY      Prior to Admission medications   Medication Sig Start Date End Date Taking? Authorizing Provider  Ascorbic Acid (VITAMIN C) 1000 MG tablet Take 1,000 mg by mouth daily.   Yes [provider]  cholecalciferol (VITAMIN D) 400 units TABS tablet Take 400 Units by mouth.   Yes [provider]  hydrochlorothiazide (HYDRODIURIL) 25 MG tablet Take 25 mg by mouth daily.   Yes [provider]  magnesium oxide (MAG-OX) 400 MG tablet Take 400 mg by mouth daily.   Yes [provider]  vitamin  B-12 (CYANOCOBALAMIN) 1000 MCG tablet Take 1,000 mcg by mouth daily.   Yes [provider]  aspirin EC 81 MG EC tablet Take 1 tablet (81 mg total) by mouth daily. 11/19/16   Milagros Loll, MD  fluticasone (FLONASE) 50 MCG/ACT nasal spray Place 2 sprays into both nostrils daily. 10/27/16   Hassan Rowan, MD  nitrofurantoin, macrocrystal-monohydrate, (MACROBID) 100 MG capsule Take 1 tablet by mouth as directed. 05/13/18   Lutricia Feil, PA-C  phenazopyridine (PYRIDIUM) 200 MG tablet Take 1 tablet (200 mg total) by mouth 3 (three) times daily. 05/13/18   Lutricia Feil, PA-C  sulfamethoxazole-trimethoprim (BACTRIM DS,SEPTRA DS) 800-160 MG tablet Take 1 tablet by mouth 2 (two) times daily. 05/13/18   Lutricia Feil, PA-C    Allergies Patient has no known allergies.  Family History  Problem Relation Age of Onset  . Lung cancer Father   . Prostate cancer Father   . Hypertension Brother   . Hypertension Brother     Social History Social History   Tobacco Use  . Smoking status: Never Smoker  . Smokeless tobacco: Never Used  Substance Use Topics  . Alcohol use: Yes    Comment: occasionally  . Drug use: No    Review of Systems  Constitutional: No fever/chills Eyes: No visual changes. ENT: No sore throat. Cardiovascular: Denies chest pain. Respiratory: Denies shortness of breath. Gastrointestinal: No  abdominal pain.   no vomiting.  No diarrhea.  No constipation. Genitourinary: Negative for dysuria. Musculoskeletal: Negative for back pain. Skin: Negative for rash. Neurological: Negative for focal weakness or numbness.   ____________________________________________   PHYSICAL EXAM:  VITAL SIGNS: ED Triage Vitals  Enc Vitals Group     BP 09/04/18 1152 (!) 169/101     Pulse Rate 09/04/18 1152 (!) 104     Resp 09/04/18 1152 16     Temp 09/04/18 1152 99.3 F (37.4 C)     Temp Source 09/04/18 1152 Oral     SpO2 09/04/18 1152 100 %     Weight 09/04/18 1152 140 lb  (63.5 kg)     Height --      Head Circumference --      Peak Flow --      Pain Score 09/04/18 1151 7     Pain Loc --      Pain Edu? --      Excl. in GC? --     Constitutional: Alert and oriented. Well appearing and in no acute distress. Eyes: Conjunctivae are normal.  EOMI.  Pupils are 3 mm, bilateral and reactive to light  head: Atraumatic. Nose: No congestion/rhinnorhea. Mouth/Throat: Mucous membranes are moist.  Neck: No stridor.  No meningismus. Cardiovascular: Normal rate, regular rhythm. Grossly normal heart sounds.  Respiratory: Normal respiratory effort.  No retractions. Lungs CTAB. Gastrointestinal: Soft and nontender. No distention. Musculoskeletal: No lower extremity tenderness nor edema.  No joint effusions. Neurologic:  Normal speech and language. No gross focal neurologic deficits are appreciated. Skin:  Skin is warm, dry and intact. No rash noted. Psychiatric: Mood and affect are normal. Speech and behavior are normal.  ____________________________________________   LABS (all labs ordered are listed, but only abnormal results are displayed)  Labs Reviewed  BASIC METABOLIC PANEL - Abnormal; Notable for the following components:      Result Value   Glucose, Bld 108 (*)    All other components within normal limits  CBC WITH DIFFERENTIAL/PLATELET   ____________________________________________  EKG   ____________________________________________  RADIOLOGY  No acute finding on CT the brain. ____________________________________________   PROCEDURES  Procedure(s) performed:   Procedures  Critical Care performed:   ____________________________________________   INITIAL IMPRESSION / ASSESSMENT AND PLAN / ED COURSE  Pertinent labs & imaging results that were available during my care of the patient were reviewed by me and considered in my medical decision making (see chart for details).  Differential diagnosis includes, but is not limited to,  intracranial hemorrhage, meningitis/encephalitis, previous head trauma, cavernous venous thrombosis, tension headache, temporal arteritis, migraine or migraine equivalent, idiopathic intracranial hypertension, and non-specific headache. As part of my medical decision making, I reviewed the following data within the electronic MEDICAL RECORD NUMBER Notes from prior ED visits.  Also reviewed CAT scan from January 2018 without any aneurysmal change.  ----------------------------------------- 2:04 PM on 09/04/2018 -----------------------------------------  Patient says that she is near pain-free at this time.  Says that she feels just mildly "sore" posteriorly.  Will discharge with Fioricet.  Likely tension type headache.  Unlikely to be secondary cephalgia.  Patient be discharged at this time.  To follow-up with her primary care doctor.  Also without any tenderness or nodularity along the distribution of the temporal arteries, bilaterally. ____________________________________________   FINAL CLINICAL IMPRESSION(S) / ED DIAGNOSES  Headache   NEW MEDICATIONS STARTED DURING THIS VISIT:  New Prescriptions   No medications on file     Note:  This document was prepared using Dragon voice recognition software and may include unintentional dictation errors.     Myrna Blazer, MD 09/04/18 [phone]

## 2018-09-04 NOTE — ED Notes (Addendum)
Pt states history of grinding teeth, but wears a mouth piece and this headache does not  Feel like her normal jaw pain. Light does not bother eyes per pt, no history of migraines but old C1 fracture

## 2018-11-24 ENCOUNTER — Ambulatory Visit
Admission: EM | Admit: 2018-11-24 | Discharge: 2018-11-24 | Disposition: A | Discharge status: Home / Self Care | Payer: BC Managed Care – PPO | Attending: Emergency Medicine | Admitting: Emergency Medicine

## 2018-11-24 DIAGNOSIS — R05 Cough: Secondary | ICD-10-CM | POA: Diagnosis not present

## 2018-11-24 DIAGNOSIS — J111 Influenza due to unidentified influenza virus with other respiratory manifestations: Secondary | ICD-10-CM

## 2018-11-24 DIAGNOSIS — R300 Dysuria: Secondary | ICD-10-CM | POA: Insufficient documentation

## 2018-11-24 DIAGNOSIS — R69 Illness, unspecified: Secondary | ICD-10-CM | POA: Insufficient documentation

## 2018-11-24 LAB — URINALYSIS, COMPLETE (UACMP) WITH MICROSCOPIC
BILIRUBIN URINE: NEGATIVE
Glucose, UA: NEGATIVE mg/dL
KETONES UR: NEGATIVE mg/dL
NITRITE: NEGATIVE
Protein, ur: NEGATIVE mg/dL
Specific Gravity, Urine: >1.03 — ABNORMAL HIGH (ref 1.005–1.030)
pH: 5.5 (ref 5.0–8.0)

## 2018-11-24 LAB — RAPID INFLUENZA A&B ANTIGENS: Influenza B (ARMC): NEGATIVE

## 2018-11-24 LAB — RAPID STREP SCREEN (MED CTR MEBANE ONLY): Streptococcus, Group A Screen (Direct): NEGATIVE

## 2018-11-24 LAB — RAPID INFLUENZA A&B ANTIGENS (ARMC ONLY): INFLUENZA A (ARMC): NEGATIVE

## 2018-11-24 MED ORDER — FLUCONAZOLE 150 MG PO TABS: 150.0000 mg | ORAL_TABLET | Freq: Every day | ORAL | 0 refills | Status: DC

## 2018-11-24 MED ORDER — CEPHALEXIN 500 MG PO CAPS: 500.0000 mg | ORAL_CAPSULE | Freq: Two times a day (BID) | ORAL | 0 refills | Status: AC

## 2018-11-24 NOTE — ED Provider Notes (Signed)
[facility] ____________________________________________  Time seen: Approximately 4:24 PM  I have reviewed the triage vital signs and the nursing notes.   HISTORY  Chief Complaint Sore Throat (APPT)   HPI Monica Day is a 53 y.o. female presenting for evaluation of hoarse voice and some sore throat since Monday.  States on Tuesday she began to develop low-grade fever with gradual onset of cough then and yesterday.  Continues with intermittent cough.  States sore throat is predominantly with coughing.  Able to continue to overall eat and drink well.  States T-max 101.7 this afternoon.  Has taken over-the-counter Tylenol and ibuprofen.  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  Denies abdominal pain, back pain, chest pain, shortness of breath.  Does report over the last approximately 1 month she has noticed some foul odor to her urine.  States that she has a history of recurrent UTIs.  Denies vaginal discharge or vaginal irritation.  Continues to eat and drink well.  No urinary frequency, urgency or burning with urination.  Denies recent sickness.  Reports otherwise doing well.  Thomes Dinning, MD: PCP    Past Medical History:  Diagnosis Date  . Hypertension     Patient Active Problem List   Diagnosis Date Noted  . Left arm weakness 11/18/2016    Past Surgical History:  Procedure Laterality Date  . ABDOMINAL HYSTERECTOMY  01/2011  . HIP SURGERY Right 02/2016   Reconstruction  . NASAL SINUS SURGERY       No current facility-administered medications for this encounter.   Current Outpatient Medications:  .  hydrochlorothiazide (HYDRODIURIL) 25 MG tablet, Take 25 mg by mouth daily., Disp: , Rfl:  .  propranolol ER (INDERAL LA) 120 MG 24 hr capsule, Take by mouth daily., Disp: , Rfl:  .  Ascorbic Acid (VITAMIN C) 1000 MG tablet, Take 1,000 mg by mouth daily., Disp: , Rfl:  .  aspirin EC 81 MG EC tablet, Take 1 tablet (81 mg total) by  mouth daily., Disp: , Rfl:  .  butalbital-acetaminophen-caffeine (FIORICET, ESGIC) 50-325-40 MG tablet, Take 1-2 tablets by mouth every 6 (six) hours as needed for headache., Disp: 20 tablet, Rfl: 0 .  cephALEXin (KEFLEX) 500 MG capsule, Take 1 capsule (500 mg total) by mouth 2 (two) times daily for 7 days., Disp: 14 capsule, Rfl: 0 .  cholecalciferol (VITAMIN D) 400 units TABS tablet, Take 400 Units by mouth., Disp: , Rfl:  .  fluconazole (DIFLUCAN) 150 MG tablet, Take 1 tablet (150 mg total) by mouth daily. Take one pill orally, as needed., Disp: 1 tablet, Rfl: 0 .  fluticasone (FLONASE) 50 MCG/ACT nasal spray, Place 2 sprays into both nostrils daily., Disp: 16 g, Rfl: 0 .  magnesium oxide (MAG-OX) 400 MG tablet, Take 400 mg by mouth daily., Disp: , Rfl:  .  nitrofurantoin, macrocrystal-monohydrate, (MACROBID) 100 MG capsule, Take 1 tablet by mouth as directed., Disp: 30 capsule, Rfl: 1 .  phenazopyridine (PYRIDIUM) 200 MG tablet, Take 1 tablet (200 mg total) by mouth 3 (three) times daily., Disp: 6 tablet, Rfl: 0 .  sulfamethoxazole-trimethoprim (BACTRIM DS,SEPTRA DS) 800-160 MG tablet, Take 1 tablet by mouth 2 (two) times daily., Disp: 14 tablet, Rfl: 0 .  vitamin B-12 (CYANOCOBALAMIN) 1000 MCG tablet, Take 1,000 mcg by mouth daily., Disp: , Rfl:   Allergies Patient has no known allergies.  Family History  Problem Relation Age of Onset  . Lung cancer Father   . Prostate cancer Father   .  Hypertension Brother   . Hypertension Brother     Social History Social History   Tobacco Use  . Smoking status: Never Smoker  . Smokeless tobacco: Never Used  Substance Use Topics  . Alcohol use: Yes    Comment: occasionally  . Drug use: No    Review of Systems Constitutional: Positive fever ENT: As above Cardiovascular: Denies chest pain. Respiratory: Denies shortness of breath. Gastrointestinal: No abdominal pain.  No nausea, no vomiting.  No diarrhea.   Genitourinary: As above    musculoskeletal: Negative for back pain. Skin: Negative for rash.   ____________________________________________   PHYSICAL EXAM:  VITAL SIGNS: ED Triage Vitals  Enc Vitals Group     BP 11/24/18 1543 109/75     Pulse Rate 11/24/18 1543 84     Resp 11/24/18 1543 18     Temp 11/24/18 1543 99.7 F (37.6 C)     Temp Source 11/24/18 1543 Oral     SpO2 11/24/18 1543 99 %     Weight 11/24/18 1544 140 lb (63.5 kg)     Height 11/24/18 1544 5\' 8"  (1.727 m)     Head Circumference --      Peak Flow --      Pain Score 11/24/18 1543 0     Pain Loc --      Pain Edu? --      Excl. in GC? --    Constitutional: Alert and oriented. Well appearing and in no acute distress. Eyes: Conjunctivae are normal. Head: Atraumatic. No sinus tenderness to palpation. No swelling. No erythema.  Ears: no erythema, normal TMs bilaterally.   Nose:Mild nasal congestion   Mouth/Throat: Mucous membranes are moist. Mild pharyngeal erythema. No tonsillar swelling or exudate.  Neck: No stridor.  No cervical spine tenderness to palpation. Hematological/Lymphatic/Immunilogical: No cervical lymphadenopathy. Cardiovascular: Normal rate, regular rhythm. Grossly normal heart sounds.  Good peripheral circulation. Respiratory: Normal respiratory effort.  No retractions. No wheezes, rales or rhonchi. Good air movement.  Gastrointestinal: Soft and nontender. No CVA tenderness. Musculoskeletal: Ambulatory with steady gait. No cervical, thoracic or lumbar tenderness to palpation. Neurologic:  Normal speech and language. No gait instability. Skin:  Skin appears warm, dry and intact. No rash noted. Psychiatric: Mood and affect are normal. Speech and behavior are normal. ___________________________________________   LABS (all labs ordered are listed, but only abnormal results are displayed)  Labs Reviewed  URINALYSIS, COMPLETE (UACMP) WITH MICROSCOPIC - Abnormal; Notable for the following components:      Result Value    APPearance CLOUDY (*)    Specific Gravity, Urine >1.030 (*)    Hgb urine dipstick TRACE (*)    Leukocytes, UA TRACE (*)    Bacteria, UA FEW (*)    All other components within normal limits  RAPID STREP SCREEN ([facility] ONLY)  RAPID INFLUENZA A&B ANTIGENS ([facility] ONLY)  CULTURE, GROUP A STREP [facility])  URINE CULTURE    PROCEDURES Procedures     INITIAL IMPRESSION / ASSESSMENT AND PLAN / ED COURSE  Pertinent labs & imaging results that were available during my care of the patient were reviewed by me and considered in my medical decision making (see chart for details).  Well-appearing patient.  No acute distress.  Quick strep negative, will culture.  Influenza negative however suspect influenza.  Patient also reports she has been having some foul odor urine, urinalysis reviewed, concern for UTI.  We will culture urine and empirically start on oral Keflex.  Rx 1 Diflucan per  patient request.  Patient Past timeframe for Tamiflu.  Encourage rest, fluids, supportive care, over-the-counter Tylenol ibuprofen as needed.  Patient declined need for cough medication.  Work note given.Discussed indication, risks and benefits of medications with patient.  Discussed follow up with Primary care physician this week. Discussed follow up and return parameters including no resolution or any worsening concerns. Patient verbalized understanding and agreed to plan.   ____________________________________________   FINAL CLINICAL IMPRESSION(S) / ED DIAGNOSES  Final diagnoses:  Influenza-like illness  Dysuria     ED Discharge Orders         Ordered    cephALEXin (KEFLEX) 500 MG capsule  2 times daily     11/24/18 1704    fluconazole (DIFLUCAN) 150 MG tablet  Daily     11/24/18 1704           Note: This dictation was prepared with Dragon dictation along with smaller phrase technology. Any transcriptional errors that result from this process are unintentional.         Renford DillsMiller, Zyan Coby,  NP 11/24/18 [phone]

## 2018-11-24 NOTE — Discharge Instructions (Signed)
Take medication as prescribed. Rest. Drink plenty of fluids. Tylenol and ibuprofen as needed.  ° °Follow up with your primary care physician this week as needed. Return to Urgent care for new or worsening concerns.  ° °

## 2018-11-24 NOTE — ED Triage Notes (Signed)
Pt stated since Monday she has had laryngitis and then Tuesday developed a low grade fever. Does have a cough as well. Has been taking tylenol, salt water and honey. Does teach school.

## 2018-11-27 LAB — URINE CULTURE: Culture: >=100000 — AB

## 2018-11-27 LAB — CULTURE, GROUP A STREP (THRC)

## 2018-11-28 ENCOUNTER — Telehealth (HOSPITAL_COMMUNITY): Payer: Self-pay | Admitting: Emergency Medicine

## 2018-11-28 NOTE — Telephone Encounter (Signed)
Urine culture was positive for Klebsiella pneumoniae and was given cephalexin  at urgent care visit. Attempted call no answer [facility]

## 2019-02-01 DIAGNOSIS — K649 Unspecified hemorrhoids: Secondary | ICD-10-CM | POA: Insufficient documentation

## 2019-03-16 IMAGING — CT CT HEAD W/O CM
3 series · 15 of 46 positions shown, 18 images · non-contrast
Comparison: Head CT 11/17/2016.

CLINICAL DATA: 52-year-old female with history of headache and jaw
pain for the past 3 days. Nausea.

EXAM:
CT HEAD WITHOUT CONTRAST
TECHNIQUE: Contiguous axial images were obtained from the base of the skull
through the vertex without intravenous contrast.

[Series 2: head wo · axial · 0.47mm/px · z∈[-141,-21]mm · 9 of 29 slices shown, 12 images]
[im 3/29  brain]
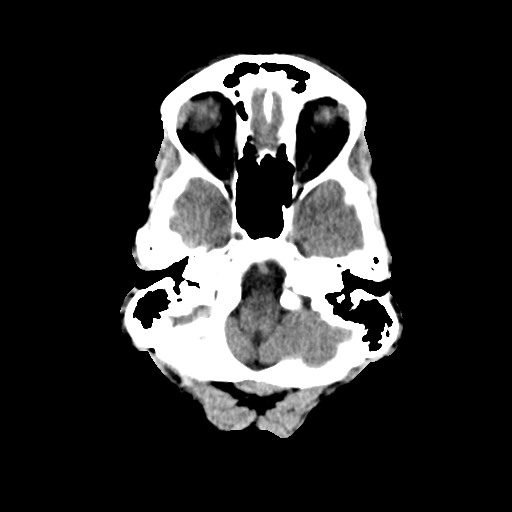
[im 3/29  bone]
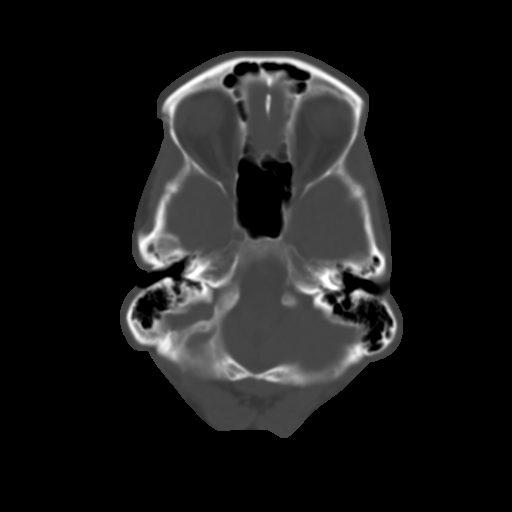
[im 6/29  brain]
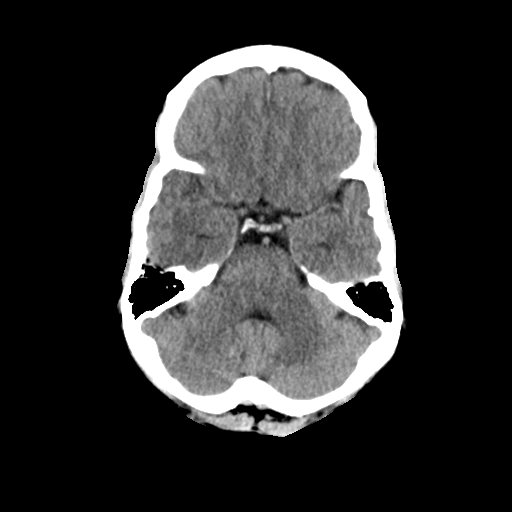
[im 9/29  brain]
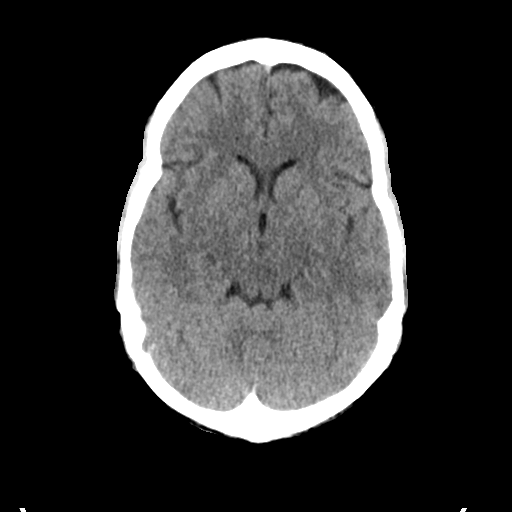
[im 12/29  brain]
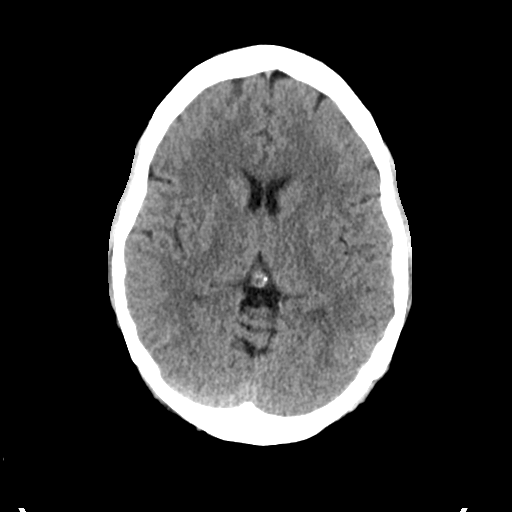
[im 15/29  brain]
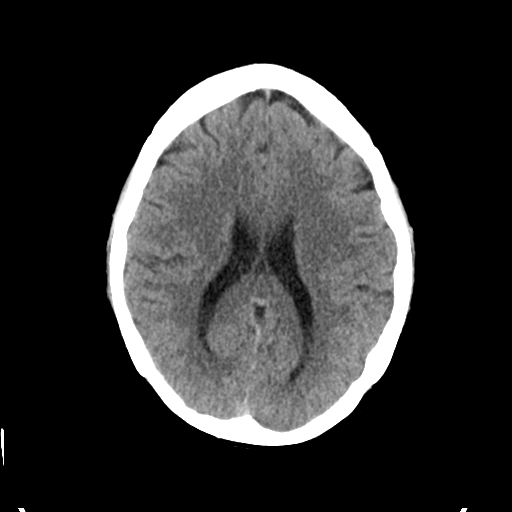
[im 15/29  bone]
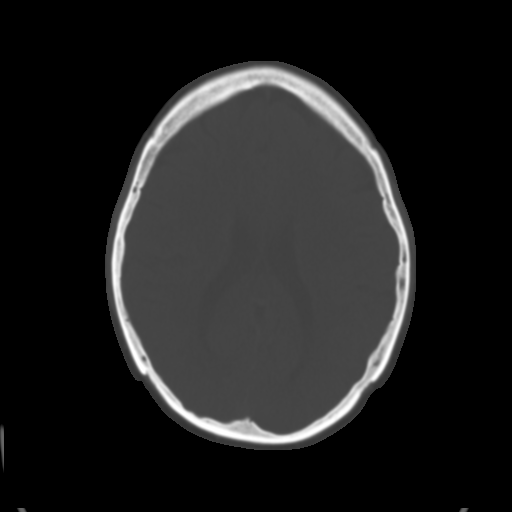
[im 18/29  brain]
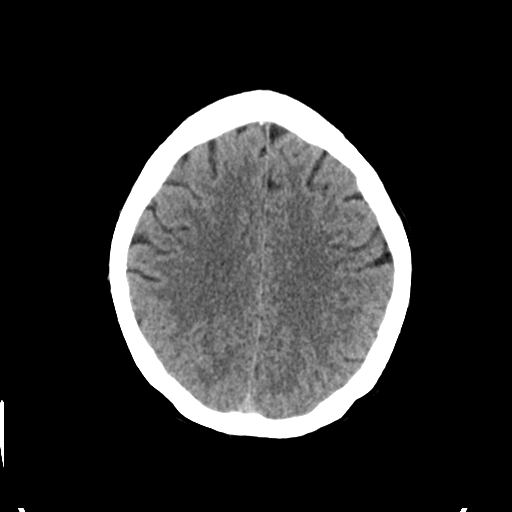
[im 21/29  brain]
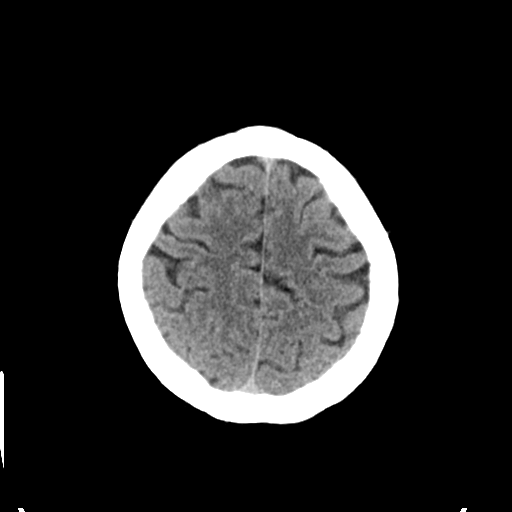
[im 24/29  brain]
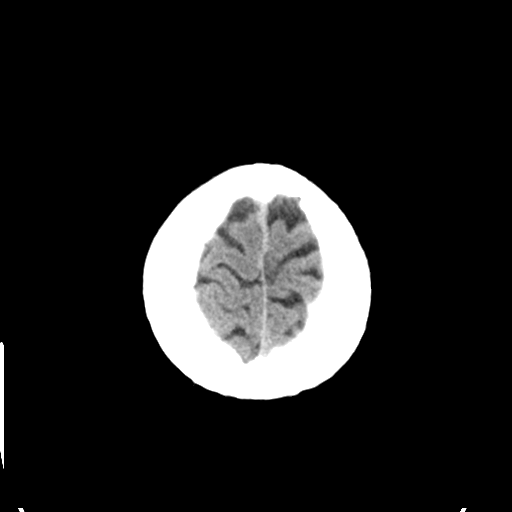
[im 27/29  brain]
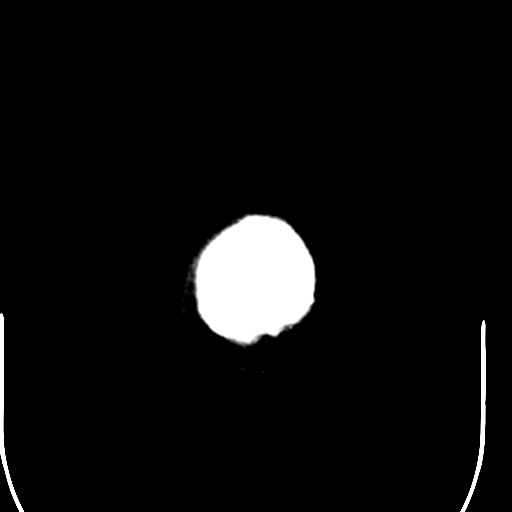
[im 27/29  bone]
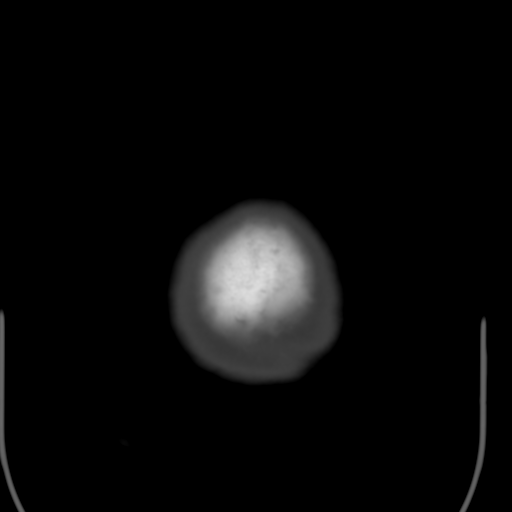

[Series 4: coronal soft tissue · coronal · 0.28mm/px · 3 of 67 slices shown]
[im 23/67  brain]
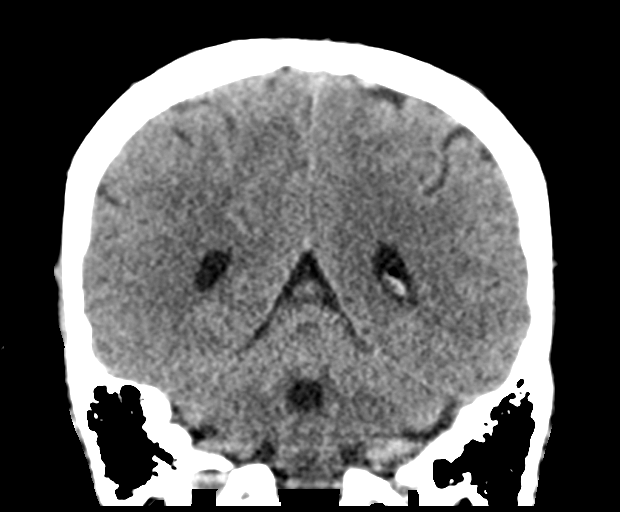
[im 30/67  brain]
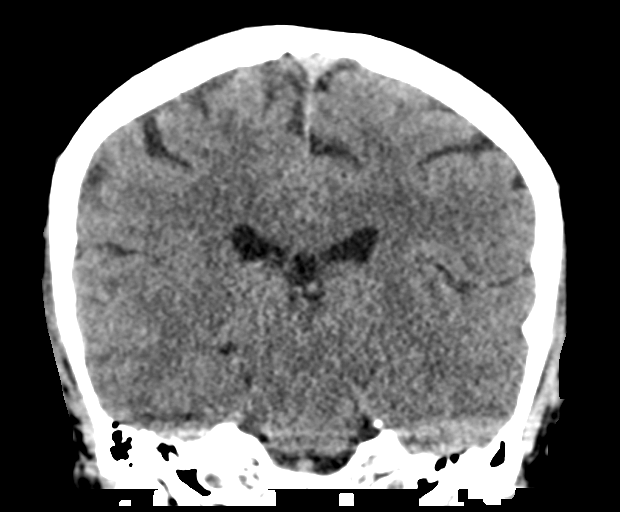
[im 37/67  brain]
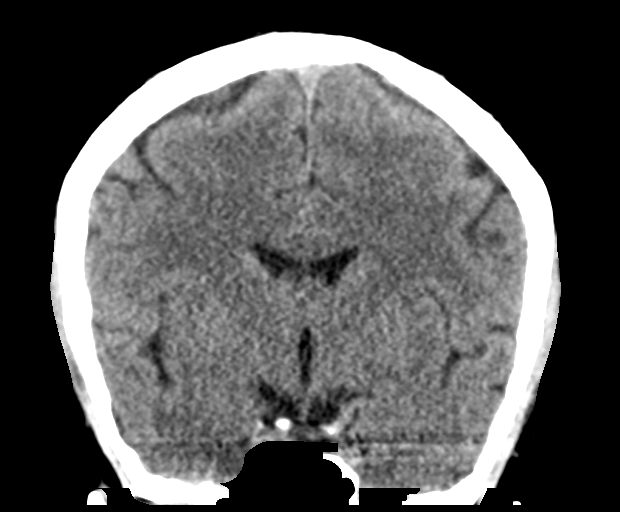

[Series 5: sagittal soft tissue · sagittal · 0.29mm/px · 3 of 58 slices shown]
[im 20/58  brain]
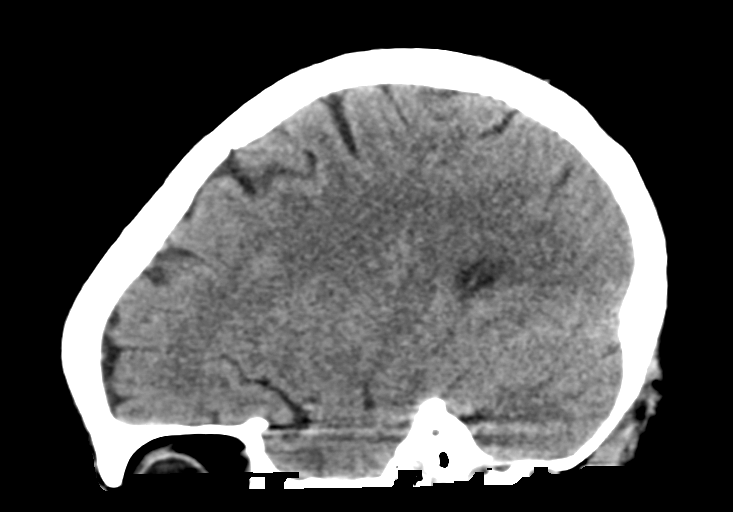
[im 29/58  brain]
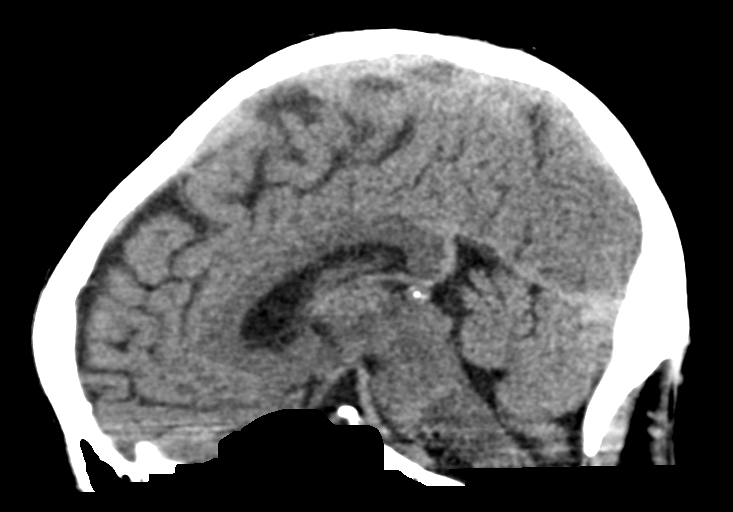
[im 39/58  brain]
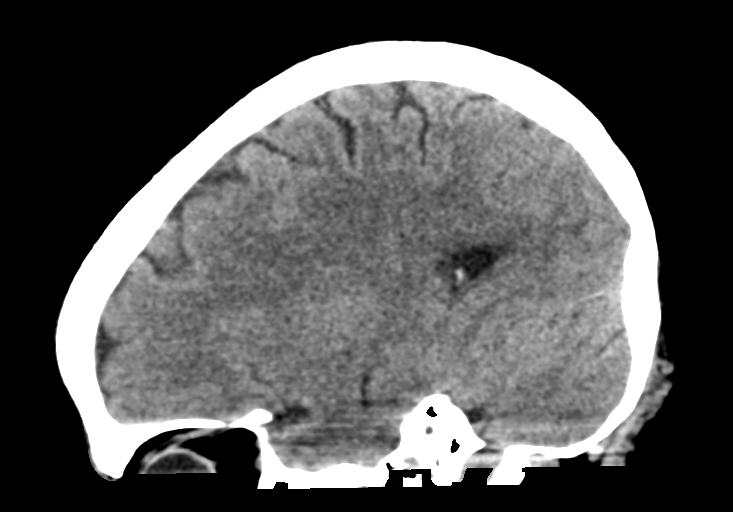

[15 of 46 positions shown; findings below may reference images not displayed]

FINDINGS: Brain: No evidence of acute infarction, hemorrhage, hydrocephalus,
extra-axial collection or mass lesion/mass effect.

Vascular: No hyperdense vessel or unexpected calcification.

Skull: Normal. Negative for fracture or focal lesion.

Sinuses/Orbits: No acute finding.

Other: None.
IMPRESSION: 1. No acute intracranial abnormalities. The appearance of the brain
is normal.

## 2019-09-26 DIAGNOSIS — U071 COVID-19: Secondary | ICD-10-CM

## 2019-09-26 HISTORY — DX: COVID-19: U07.1

## 2019-10-03 DIAGNOSIS — G4700 Insomnia, unspecified: Secondary | ICD-10-CM | POA: Insufficient documentation

## 2020-07-04 ENCOUNTER — Ambulatory Visit
Admission: EM | Admit: 2020-07-04 | Discharge: 2020-07-04 | Disposition: A | Discharge status: Home / Self Care | Payer: BC Managed Care – PPO | Attending: Physician Assistant | Admitting: Physician Assistant

## 2020-07-04 ENCOUNTER — Other Ambulatory Visit: Payer: Self-pay

## 2020-07-04 ENCOUNTER — Encounter: Payer: Self-pay | Admitting: Emergency Medicine

## 2020-07-04 DIAGNOSIS — N3001 Acute cystitis with hematuria: Secondary | ICD-10-CM | POA: Diagnosis present

## 2020-07-04 LAB — URINALYSIS, COMPLETE (UACMP) WITH MICROSCOPIC
Bilirubin Urine: NEGATIVE
Glucose, UA: NEGATIVE mg/dL
Nitrite: NEGATIVE
Protein, ur: NEGATIVE mg/dL
RBC / HPF: >50 RBC/hpf (ref 0–5)
Specific Gravity, Urine: 1.025 (ref 1.005–1.030)
pH: 5 (ref 5.0–8.0)

## 2020-07-04 MED ORDER — NITROFURANTOIN MONOHYD MACRO 100 MG PO CAPS: 100.0000 mg | ORAL_CAPSULE | Freq: Two times a day (BID) | ORAL | 0 refills | Status: AC

## 2020-07-04 NOTE — Discharge Instructions (Signed)

## 2020-07-04 NOTE — ED Triage Notes (Signed)
Patient c/o dysuria, urinary frequency and hematuria that started this morning.

## 2020-07-04 NOTE — ED Provider Notes (Signed)
[facility]    CSN: [id number] Arrival date & time: 07/04/20  1228      History   Chief Complaint Chief Complaint  Patient presents with  . Dysuria  . Urinary Frequency  . Hematuria    HPI Monica Day is a 54 y.o. female.   Patient presents for onset of urinary frequency and urgency this morning.  She says she also has had some mild dysuria and noticed a small amount of spotting on her toilet paper.  She denies any abnormal vaginal discharge.  She denies fever.  She admits to some suprapubic pressure.  Denies pelvic pain or back pain.  Denies nausea, vomiting or chills.  Patient states she has a history of urinary tract infections and believes her symptoms are consistent with UTI.  Has not taken any over-the-counter medicines for symptoms.  No other concerns today.  HPI  Past Medical History:  Diagnosis Date  . Hypertension   . Lab test positive for detection of COVID-19 virus 09/2019    Patient Active Problem List   Diagnosis Date Noted  . Left arm weakness 11/18/2016    Past Surgical History:  Procedure Laterality Date  . ABDOMINAL HYSTERECTOMY  01/2011  . HIP SURGERY Right 02/2016   Reconstruction  . NASAL SINUS SURGERY      OB History   No obstetric history on file.      Home Medications    Prior to Admission medications   Medication Sig Start Date End Date Taking? Authorizing Provider  Ascorbic Acid (VITAMIN C) 1000 MG tablet Take 1,000 mg by mouth daily.   Yes [provider]  aspirin EC 81 MG EC tablet Take 1 tablet (81 mg total) by mouth daily. 11/19/16  Yes Sudini, Wardell Heath, MD  cholecalciferol (VITAMIN D) 400 units TABS tablet Take 400 Units by mouth.   Yes [provider]  hydrochlorothiazide (HYDRODIURIL) 25 MG tablet Take 25 mg by mouth daily.   Yes [provider]  magnesium oxide (MAG-OX) 400 MG tablet Take 400 mg by mouth daily.   Yes [provider]  propranolol ER (INDERAL LA) 120 MG 24 hr  capsule Take by mouth daily. 10/07/18  Yes [provider]  vitamin B-12 (CYANOCOBALAMIN) 1000 MCG tablet Take 1,000 mcg by mouth daily.   Yes [provider]  fluconazole (DIFLUCAN) 150 MG tablet Take 1 tablet (150 mg total) by mouth daily. Take one pill orally, as needed. 11/24/18   Renford Dills, NP  fluticasone (FLONASE) 50 MCG/ACT nasal spray Place 2 sprays into both nostrils daily. 10/27/16   Hassan Rowan, MD  nitrofurantoin, macrocrystal-monohydrate, (MACROBID) 100 MG capsule Take 1 capsule (100 mg total) by mouth 2 (two) times daily for 5 days. 07/04/20 07/09/20  Shirlee Latch, PA-C  phenazopyridine (PYRIDIUM) 200 MG tablet Take 1 tablet (200 mg total) by mouth 3 (three) times daily. 05/13/18   Lutricia Feil, PA-C  sulfamethoxazole-trimethoprim (BACTRIM DS,SEPTRA DS) 800-160 MG tablet Take 1 tablet by mouth 2 (two) times daily. 05/13/18   Lutricia Feil, PA-C    Family History Family History  Problem Relation Age of Onset  . Lung cancer Father   . Prostate cancer Father   . Hypertension Brother   . Hypertension Brother     Social History Social History   Tobacco Use  . Smoking status: Never Smoker  . Smokeless tobacco: Never Used  Vaping Use  . Vaping Use: Never used  Substance Use Topics  . Alcohol use:  Yes    Comment: occasionally  . Drug use: No     Allergies   Patient has no known allergies.   Review of Systems Review of Systems  Constitutional: Negative for fatigue and fever.  Respiratory: Negative for cough.   Cardiovascular: Negative for chest pain.  Gastrointestinal: Positive for abdominal pain. Negative for nausea and vomiting.  Genitourinary: Positive for dysuria, frequency and hematuria. Negative for flank pain, urgency, vaginal bleeding, vaginal discharge and vaginal pain.  Musculoskeletal: Negative for back pain.  Skin: Negative for rash.  Neurological: Negative for weakness.     Physical Exam Triage Vital Signs ED Triage  Vitals  Enc Vitals Group     BP 07/04/20 1253 114/73     Pulse Rate 07/04/20 1253 63     Resp 07/04/20 1253 18     Temp 07/04/20 1253 98.3 F (36.8 C)     Temp Source 07/04/20 1253 Oral     SpO2 07/04/20 1253 99 %     Weight 07/04/20 1250 145 lb (65.8 kg)     Height 07/04/20 1250 5\' 8"  (1.727 m)     Head Circumference --      Peak Flow --      Pain Score 07/04/20 1250 2     Pain Loc --      Pain Edu? --      Excl. in GC? --    No data found.  Updated Vital Signs BP 114/73 (BP Location: Right Arm)   Pulse 63   Temp 98.3 F (36.8 C) (Oral)   Resp 18   Ht 5\' 8"  (1.727 m)   Wt 145 lb (65.8 kg)   SpO2 99%   BMI 22.05 kg/m      Physical Exam Vitals and nursing note reviewed.  Constitutional:      General: She is not in acute distress.    Appearance: Normal appearance. She is not ill-appearing or toxic-appearing.  HENT:     Head: Normocephalic and atraumatic.  Eyes:     General: No scleral icterus.       Right eye: No discharge.        Left eye: No discharge.     Conjunctiva/sclera: Conjunctivae normal.  Cardiovascular:     Rate and Rhythm: Normal rate and regular rhythm.     Heart sounds: Normal heart sounds.  Pulmonary:     Effort: Pulmonary effort is normal. No respiratory distress.     Breath sounds: Normal breath sounds.  Abdominal:     Palpations: Abdomen is soft.     Tenderness: There is abdominal tenderness (suprapubic tenderness). There is no right CVA tenderness or left CVA tenderness.  Musculoskeletal:     Cervical back: Neck supple.  Skin:    General: Skin is dry.  Neurological:     General: No focal deficit present.     Mental Status: She is alert. Mental status is at baseline.     Motor: No weakness.     Gait: Gait normal.  Psychiatric:        Mood and Affect: Mood normal.        Behavior: Behavior normal.        Thought Content: Thought content normal.      UC Treatments / Results  Labs (all labs ordered are listed, but only abnormal  results are displayed) Labs Reviewed  URINALYSIS, COMPLETE (UACMP) WITH MICROSCOPIC - Abnormal; Notable for the following components:      Result Value   APPearance HAZY (*)  Hgb urine dipstick LARGE (*)    Ketones, ur TRACE (*)    Leukocytes,Ua SMALL (*)    Bacteria, UA MANY (*)    All other components within normal limits    EKG   Radiology No results found.  Procedures Procedures (including critical care time)  Medications Ordered in UC Medications - No data to display  Initial Impression / Assessment and Plan / UC Course  I have reviewed the triage vital signs and the nursing notes.  Pertinent labs & imaging results that were available during my care of the patient were reviewed by me and considered in my medical decision making (see chart for details).    Final Clinical Impressions(s) / UC Diagnoses   Final diagnoses:  Acute cystitis with hematuria     Discharge Instructions     UTI: Based on either symptoms or urinalysis, you may have a urinary tract infection. We will send the urine for culture and call with results in a few days. Begin antibiotics at this time. Your symptoms should be much improved over the next 2-3 days. Increase rest and fluid intake. If for some reason symptoms are worsening or not improving after a couple of days or the urine culture determines the antibiotics you are taking will not treat the infection, the antibiotics may be changed. Return or go to ER for fever, back pain, worsening urinary pain, discharge, increased blood in urine. May take Tylenol or Motrin OTC for pain relief or consider AZO if no contraindications     ED Prescriptions    Medication Sig Dispense Auth. Provider   nitrofurantoin, macrocrystal-monohydrate, (MACROBID) 100 MG capsule Take 1 capsule (100 mg total) by mouth 2 (two) times daily for 5 days. 10 capsule Shirlee Latch, PA-C     PDMP not reviewed this encounter.   Shirlee Latch, PA-C 07/04/20 1356

## 2021-06-18 DIAGNOSIS — K219 Gastro-esophageal reflux disease without esophagitis: Secondary | ICD-10-CM | POA: Insufficient documentation

## 2021-11-17 DIAGNOSIS — Z0000 Encounter for general adult medical examination without abnormal findings: Secondary | ICD-10-CM | POA: Insufficient documentation

## 2021-11-19 DIAGNOSIS — N3941 Urge incontinence: Secondary | ICD-10-CM | POA: Insufficient documentation

## 2021-12-21 ENCOUNTER — Other Ambulatory Visit: Payer: Self-pay

## 2021-12-21 ENCOUNTER — Ambulatory Visit
Admission: EM | Admit: 2021-12-21 | Discharge: 2021-12-21 | Disposition: A | Discharge status: Home / Self Care | Payer: BC Managed Care – PPO

## 2021-12-21 DIAGNOSIS — J0100 Acute maxillary sinusitis, unspecified: Secondary | ICD-10-CM

## 2021-12-21 MED ORDER — AMOXICILLIN-POT CLAVULANATE 875-125 MG PO TABS: 1.0000 | ORAL_TABLET | Freq: Two times a day (BID) | ORAL | 0 refills | Status: AC

## 2021-12-21 MED ORDER — IPRATROPIUM BROMIDE 0.06 % NA SOLN: 2.0000 | Freq: Four times a day (QID) | NASAL | 12 refills | Status: DC

## 2021-12-21 NOTE — ED Provider Notes (Signed)
[facility]    CSN: [id number] Arrival date & time: 12/21/21  0846      History   Chief Complaint Chief Complaint  Patient presents with   Nasal Congestion    HPI Monica Day is a 56 y.o. female.   HPI  57 old female here for evaluation of respiratory complaints.  She has been experiencing nasal congestion, sinus pressure, and a stuffed up nose.  When she blows her nose she is able to get out thick yellow mucus but it does not run and she is not experiencing postnasal drip.  She also denies fever or sore throat.  She does have an intermittent cough which is more to clear her throat from a tickle in the back of her throat but no sputum production shortness of breath, or wheezing.  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  She came in today because this is a different presentation from when she typically gets cold and has not resolved with nasal irrigation and over-the-counter medication.  Past Medical History:  Diagnosis Date   Hypertension    Lab test positive for detection of COVID-19 virus 09/2019    Patient Active Problem List   Diagnosis Date Noted   Left arm weakness 11/18/2016    Past Surgical History:  Procedure Laterality Date   ABDOMINAL HYSTERECTOMY  01/2011   HIP SURGERY Right 02/2016   Reconstruction   NASAL SINUS SURGERY      OB History   No obstetric history on file.      Home Medications    Prior to Admission medications   Medication Sig Start Date End Date Taking? Authorizing Provider  amoxicillin-clavulanate (AUGMENTIN) 875-125 MG tablet Take 1 tablet by mouth every 12 (twelve) hours for 10 days. 12/21/21 12/31/21 Yes Margarette Canada, NP  Ascorbic Acid (VITAMIN C) 1000 MG tablet Take 1,000 mg by mouth daily.   Yes [provider]  ipratropium (ATROVENT) 0.06 % nasal spray Place 2  sprays into both nostrils 4 (four) times daily. 12/21/21  Yes Margarette Canada, NP  lisinopril (ZESTRIL) 10 MG tablet Take 1 tablet by mouth daily. 09/20/21  Yes [provider]  lisinopril (ZESTRIL) 10 MG tablet Take 10 mg by mouth daily. 12/20/21  Yes [provider]  propranolol (INNOPRAN XL) 120 MG 24 hr capsule Take by mouth. 05/13/21  Yes [provider]  aspirin EC 81 MG EC tablet Take 1 tablet (81 mg total) by mouth daily. 11/19/16   Hillary Bow, MD  cholecalciferol (VITAMIN D) 400 units TABS tablet Take 400 Units by mouth.    [provider]  fluconazole (DIFLUCAN) 150 MG tablet Take 1 tablet (150 mg total) by mouth daily. Take one pill orally, as needed. 11/24/18   Marylene Land, NP  fluticasone (FLONASE) 50 MCG/ACT nasal spray Place 2 sprays into both nostrils daily. 10/27/16   Frederich Cha, MD  lansoprazole (PREVACID) 15 MG capsule Take by mouth. 12/20/21   [provider]  lisinopril-hydrochlorothiazide (ZESTORETIC) 10-12.5 MG tablet lisinopril 10 mg-hydrochlorothiazide 12.5 mg tablet    [provider]  magnesium oxide (MAG-OX) 400 MG tablet Take 400 mg by mouth daily.    [provider]  phenazopyridine (PYRIDIUM) 200 MG tablet Take 1 tablet (200 mg total) by mouth 3 (three) times daily. 05/13/18   Lorin Picket, PA-C  propranolol ER [facility]  [place]) 120 MG 24 hr capsule Take by mouth daily. 10/07/18   [provider]  vitamin B-12 (CYANOCOBALAMIN) 1000 MCG tablet Take 1,000 mcg by mouth daily.    [provider]    Family History Family History  Problem Relation Age of Onset   Lung cancer Father    Prostate cancer Father    Hypertension Brother    Hypertension Brother     Social History Social History   Tobacco Use   Smoking status: Never   Smokeless tobacco: Never  Vaping Use   Vaping Use: Never used  Substance Use Topics   Alcohol use: Yes    Comment: occasionally   Drug use: No      Allergies   Patient has no known allergies.   Review of Systems Review of Systems  Constitutional:  Negative for activity change, appetite change and fever.  HENT:  Positive for congestion and sinus pressure. Negative for ear pain, postnasal drip and sore throat.   Respiratory:  Positive for cough. Negative for shortness of breath and wheezing.   Hematological: Negative.   Psychiatric/Behavioral: Negative.      Physical Exam Triage Vital Signs ED Triage Vitals  Enc Vitals Group     BP 12/21/21 0919 135/78     Pulse Rate 12/21/21 0919 62     Resp 12/21/21 0919 20     Temp 12/21/21 0919 98.4 F (36.9 C)     Temp Source 12/21/21 0919 Oral     SpO2 12/21/21 0919 100 %     Weight 12/21/21 0916 145 lb (65.8 kg)     Height 12/21/21 0916 5' 8"  (1.727 m)     Head Circumference --      Peak Flow --      Pain Score 12/21/21 0915 3     Pain Loc --      Pain Edu? --      Excl. in [place]? --    No data found.  Updated Vital Signs BP 135/78 (BP Location: Left Arm)    Pulse 62    Temp 98.4 F (36.9 C) (Oral)    Resp 20    Ht 5' 8"  (1.727 m)    Wt 145 lb (65.8 kg)    SpO2 100%    BMI 22.05 kg/m   Visual Acuity Right Eye Distance:   Left Eye Distance:   Bilateral Distance:    Right Eye Near:   Left Eye Near:    Bilateral Near:     Physical Exam Vitals and nursing note reviewed.  Constitutional:      Appearance: Normal appearance. She is not ill-appearing.  HENT:     Head: Normocephalic and atraumatic.     Right Ear: Tympanic membrane, ear canal and external ear normal. There is no impacted cerumen.     Left Ear: Tympanic membrane, ear canal and external ear normal. There is no impacted cerumen.     Nose: Congestion and rhinorrhea present.     Mouth/Throat:     Mouth: Mucous membranes are moist.     Pharynx: Oropharynx is clear. No posterior oropharyngeal erythema.  Cardiovascular:     Rate and Rhythm: Normal rate and regular rhythm.     Pulses: Normal pulses.      Heart sounds: Normal heart sounds. No murmur heard.   No friction rub. No gallop.  Pulmonary:     Effort: Pulmonary effort is normal.     Breath sounds: Normal breath sounds. No wheezing, rhonchi or  rales.  Musculoskeletal:     Cervical back: Normal range of motion and neck supple.  Lymphadenopathy:     Cervical: No cervical adenopathy.  Skin:    General: Skin is warm and dry.     Capillary Refill: Capillary refill takes less than 2 seconds.     Findings: No erythema or rash.  Neurological:     General: No focal deficit present.     Mental Status: She is alert and oriented to person, place, and time.  Psychiatric:        Mood and Affect: Mood normal.        Behavior: Behavior normal.        Thought Content: Thought content normal.        Judgment: Judgment normal.     UC Treatments / Results  Labs (all labs ordered are listed, but only abnormal results are displayed) Labs Reviewed - No data to display  EKG   Radiology No results found.  Procedures Procedures (including critical care time)  Medications Ordered in UC Medications - No data to display  Initial Impression / Assessment and Plan / UC Course  I have reviewed the triage vital signs and the nursing notes.  Pertinent labs & imaging results that were available during my care of the patient were reviewed by me and considered in my medical decision making (see chart for details).  Patient is a nontoxic-appearing 56 year old female here for evaluation of sinus complaints as outlined in HPI above.  On exam patient has pearly-gray tympanic membranes bilaterally with normal light reflex and clear external auditory canals.  Nasal mucosa is markedly edematous and erythematous.  There is some faint yellow discharge in the right nare.  Patient does have tenderness to percussion of her maxillary sinuses.  Oropharyngeal exam is benign.  No cervical lymphadenopathy present on exam.  Cardiopulmonary exam feels clung sounds all  fields.  Patient exam is consistent with maxillary sinusitis.  I will place her on Augmentin twice daily for 10 days for treatment of her sinusitis.  I have also prescribed Atrovent nasal spray to help with nasal congestion and I have encouraged her to perform sinus irrigation to help relieve her mucus burden.  Return precautions reviewed with patient.   Final Clinical Impressions(s) / UC Diagnoses   Final diagnoses:  Acute non-recurrent maxillary sinusitis     Discharge Instructions      The Augmentin twice daily with food for 10 days for treatment of your sinusitis.  Perform sinus irrigation 2-3 times a day with a [employer] sinus rinse kit and distilled water.  Do not use tap water.  You can use plain over-the-counter Mucinex every 6 hours to break up the stickiness of the mucus so your body can clear it.  Increase your oral fluid intake to thin out your mucus so that is also able for your body to clear more easily.  Take an over-the-counter probiotic, such as Culturelle-align-activia, 1 hour after each dose of antibiotic to prevent diarrhea.  Use the Atrovent nasal spray, 2 squirts in each nostril every 6 hours, as needed for congestion.  If you develop any new or worsening symptoms return for reevaluation or see your primary care provider.      ED Prescriptions     Medication Sig Dispense Auth. Provider   amoxicillin-clavulanate (AUGMENTIN) 875-125 MG tablet Take 1 tablet by mouth every 12 (twelve) hours for 10 days. 20 tablet Margarette Canada, NP   ipratropium (ATROVENT) 0.06 % nasal spray Place 2  sprays into both nostrils 4 (four) times daily. 15 mL Margarette Canada, NP      PDMP not reviewed this encounter.   Margarette Canada, NP 12/21/21 1003

## 2021-12-21 NOTE — ED Triage Notes (Signed)
Patient is here for "Sinus infection". Stuffy head, congestion, pressure, stopped up nose. No relief for OTC. Congestion, drainage green. Pressure in ears. No fever.

## 2021-12-21 NOTE — Discharge Instructions (Signed)
The Augmentin twice daily with food for 10 days for treatment of your sinusitis. ° °Perform sinus irrigation 2-3 times a day with a NeilMed sinus rinse kit and distilled water.  Do not use tap water. ° °You can use plain over-the-counter Mucinex every 6 hours to break up the stickiness of the mucus so your body can clear it. ° °Increase your oral fluid intake to thin out your mucus so that is also able for your body to clear more easily. ° °Take an over-the-counter probiotic, such as Culturelle-align-activia, 1 hour after each dose of antibiotic to prevent diarrhea. ° °Use the Atrovent nasal spray, 2 squirts in each nostril every 6 hours, as needed for congestion. ° °If you develop any new or worsening symptoms return for reevaluation or see your primary care provider.  °

## 2022-12-03 DIAGNOSIS — R6882 Decreased libido: Secondary | ICD-10-CM | POA: Insufficient documentation

## 2023-03-10 DIAGNOSIS — R140 Abdominal distension (gaseous): Secondary | ICD-10-CM | POA: Insufficient documentation

## 2023-03-10 DIAGNOSIS — R197 Diarrhea, unspecified: Secondary | ICD-10-CM | POA: Insufficient documentation

## 2023-04-26 ENCOUNTER — Ambulatory Visit (INDEPENDENT_AMBULATORY_CARE_PROVIDER_SITE_OTHER): Payer: BC Managed Care – PPO | Admitting: Surgery

## 2023-04-26 ENCOUNTER — Encounter: Payer: Self-pay | Admitting: Surgery

## 2023-04-26 VITALS — BP 127/88 | HR 79 | Temp 98.9°F | Ht 68.0 in | Wt 151.8 lb

## 2023-04-26 DIAGNOSIS — D171 Benign lipomatous neoplasm of skin and subcutaneous tissue of trunk: Secondary | ICD-10-CM | POA: Diagnosis not present

## 2023-04-26 NOTE — Patient Instructions (Addendum)
If you have any concerns or questions, please feel free to call our office. See follow up appointment.   Lipoma Removal Lipoma removal is a surgical procedure to remove a lipoma, which is a noncancerous (benign) tumor that is made up of fat cells. Most lipomas are small and painless and do not require treatment. They can form in many areas of the body but are most common under the skin of the back, arms, shoulders, buttocks, and thighs. You may need lipoma removal if you have a lipoma that is large, growing, or causing discomfort. Lipoma removal may also be done for cosmetic reasons. Tell a health care provider about: Any allergies you have. All medicines you are taking, including vitamins, herbs, eye drops, creams, and over-the-counter medicines. Any problems you or family members have had with anesthetic medicines. Any bleeding problems you have. Any surgeries you have had. Any medical conditions you have. Whether you are pregnant or may be pregnant. What are the risks? Generally, this is a safe procedure. However, problems may occur, including: Infection. Bleeding. Scarring. Allergic reactions to medicines. Damage to nearby structures or organs, such as damage to nerves or blood vessels near the lipoma. What happens before the procedure? When to Stop Eating and Drinking Follow instructions from your health care provider about what you may eat and drink before your procedure. These may include: 8 hours before your procedure Stop eating most foods. Do not eat meat, fried foods, or fatty foods. Eat only light foods, such as toast or crackers. All liquids are okay except energy drinks and alcohol. 6 hours before your procedure Stop eating. Drink only clear liquids, such as water, clear fruit juice, black coffee, plain tea, and sports drinks. Do not drink energy drinks or alcohol. 2 hours before your procedure Stop drinking all liquids. You may be allowed to take medicines with small  sips of water. If you do not follow your health care provider's instructions, your procedure may be delayed or canceled. Medicines Ask your health care provider about: Changing or stopping your regular medicines. This is especially important if you are taking diabetes medicines or blood thinners. Taking medicines such as aspirin and ibuprofen. These medicines can thin your blood. Do not take these medicines unless your health care provider tells you to take them. Taking over-the-counter medicines, vitamins, herbs, and supplements. General instructions You will have a physical exam. Your health care provider will check the size of the lipoma and whether it can be removed easily. You may have a biopsy and imaging tests, such as X-rays, a CT scan, and an MRI. Do not use any products that contain nicotine or tobacco for at least 4 weeks before the procedure. These products include cigarettes, chewing tobacco, and vaping devices, such as e-cigarettes. If you need help quitting, ask your health care provider. Ask your health care provider: How your surgery site will be marked. What steps will be taken to help prevent infection. These may include: Washing skin with a germ-killing soap. Taking antibiotic medicine. If you will be going home right after the procedure, plan to have a responsible adult: Take you home from the hospital or clinic. You will not be allowed to drive. Care for you for the time you are told. What happens during the procedure?  An IV will be inserted into one of your veins. You will be given one or more of the following: A medicine to help you relax (sedative). A medicine to numb the area (local anesthetic). A medicine  to make you fall asleep (general anesthetic). A medicine that is injected into an area of your body to numb everything below the injection site (regional anesthetic). An incision will be made into the skin over the lipoma or very near the lipoma. The incision  may be made in a natural skin line or crease. Tissues, nerves, and blood vessels near the lipoma will be moved out of the way. The lipoma and the capsule that surrounds it will be separated from the surrounding tissues. The lipoma will be removed. The incision may be closed with stitches (sutures). A bandage (dressing) will be placed over the incision. The procedure may vary among health care providers and hospitals. What happens after the procedure? Your blood pressure, heart rate, breathing rate, and blood oxygen level will be monitored until you leave the hospital or clinic. If you were prescribed an antibiotic medicine, use it as told by your health care provider. Do not stop using the antibiotic even if you start to feel better. If you were given a sedative during the procedure, it can affect you for several hours. Do not drive or operate machinery until your health care provider says that it is safe. Where to find more information OrthoInfo: orthoinfo.aaos.org Summary Before the procedure, follow instructions from your health care provider about eating and drinking, and changing or stopping your regular medicines. This is especially important if you are taking diabetes medicines or blood thinners. After the lipoma is removed, the incision may be closed with stitches (sutures) and covered with a bandage (dressing). If you were given a sedative during the procedure, it can affect you for several hours. Do not drive or operate machinery until your health care provider says that it is safe. This information is not intended to replace advice given to you by your health care provider. Make sure you discuss any questions you have with your health care provider. Document Revised: 10/31/2021 Document Reviewed: 10/31/2021 Elsevier Patient Education  2024 [employer].

## 2023-04-26 NOTE — Progress Notes (Signed)
04/26/2023  Reason for Visit: Lipoma of right abdominal wall  Requesting Provider:  Jesusita Oka, MD  History of Present Illness: Monica Day is a 57 y.o. female presenting for evaluation of a lipoma of the right abdominal wall.  The patient reports other abdominal issues and more recently she had an ultrasound of the abdomen which showed a hepatic cyst as well as gallbladder polyps.  She has had prior workup with upper endoscopy and HIDA scan which did not show any complications with her gallbladder.  During her ultrasound, the patient was having tenderness in the right lateral abdominal wall and she thought that the ultrasound probe was pushing on that area.  There was a small mass that she could feel.  However on ultrasound there is no mention of any masses in the abdominal wall.  Reports that this mass is small in size and is not having significant discomfort except when she is lying on it.  It has not been ongoing for too long and denies any overlying skin changes, or drainage.  She reports that her husband had a lipoma removed as well but this was larger in size.  However she does not want to wait until the mass gets to that size and would rather have this addressed sooner.  Denies any other masses.  Past Medical History: Past Medical History:  Diagnosis Date   Hypertension    Lab test positive for detection of COVID-19 virus 09/2019     Past Surgical History: Past Surgical History:  Procedure Laterality Date   ABDOMINAL HYSTERECTOMY  01/2011   HIP SURGERY Right 02/2016   Reconstruction   NASAL SINUS SURGERY      Home Medications: Prior to Admission medications   Medication Sig Start Date End Date Taking? Authorizing Provider  lisinopril-hydrochlorothiazide (ZESTORETIC) 10-12.5 MG tablet lisinopril 10 mg-hydrochlorothiazide 12.5 mg tablet   Yes [provider]  propranolol (INNOPRAN XL) 120 MG 24 hr capsule Take by mouth. 05/13/21  Yes [provider]   vitamin B-12 (CYANOCOBALAMIN) 1000 MCG tablet Take 1,000 mcg by mouth daily.   Yes [provider]    Allergies: No Known Allergies  Social History:  reports that she has never smoked. She has never used smokeless tobacco. She reports current alcohol use. She reports that she does not use drugs.   Family History: Family History  Problem Relation Age of Onset   Lung cancer Father    Prostate cancer Father    Hypertension Brother    Hypertension Brother     Review of Systems: Review of Systems  Constitutional:  Negative for chills and fever.  Respiratory:  Negative for shortness of breath.   Cardiovascular:  Negative for chest pain.  Gastrointestinal:  Negative for abdominal pain, nausea and vomiting.  Skin:        Right lateral abdominal wall mass    Physical Exam BP 127/88   Pulse 79   Temp 98.9 F (37.2 C) (Oral)   Ht 5\' 8"  (1.727 m)   Wt 151 lb 12.8 oz (68.9 kg)   SpO2 97%   BMI 23.08 kg/m  CONSTITUTIONAL: No acute distress, well-nourished HEENT:  Normocephalic, atraumatic, extraocular motion intact. RESPIRATORY:  Normal respiratory effort without pathologic use of accessory muscles. CARDIOVASCULAR: Regular rhythm and rate. MUSCULOSKELETAL:  Normal muscle strength and tone in all four extremities.  No peripheral edema or cyanosis. SKIN: The patient has a 1.5 x 1 cm mass in the right lateral abdominal wall overlying towards the tip of  the 12th rib.  The mass itself is mobile, soft, with no overlying skin changes or drainage. NEUROLOGIC:  Motor and sensation is grossly normal.  Cranial nerves are grossly intact. PSYCH:  Alert and oriented to person, place and time. Affect is normal.  Laboratory Analysis: Labs from 02/19/2023: WBC 6.8, hemoglobin 12.9, hematocrit 38.5, platelets 169.  Labs from 03/10/2023: Sodium 143, potassium 4.3, chloride 107, CO2 30, BUN 9, creatinine 0.74.  LFTs within normal limits, albumin 4.0.  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   Gallbladder polyp. No findings of acute cholecystitis.   Nonobstructive 0.9 cm right renal calculi.    Assessment and Plan: This is a 57 y.o. female with abdominal discomfort as well as a right lateral abdominal wall lipoma.  - Discussed with the patient the findings on her exam showing a small soft and mobile mass which is very consistent with a lipoma.  Discussed that the discomfort from it may be related to the location as it is overlying the tip of the 12th rib.  I do think that this mass is subcutaneous and does not penetrate into the intercostal muscles.  Discussed with her that we can excise this as an office procedure and reviewed the procedure at length with her including the planned incisions, risks of bleeding, infection, injury to surrounding structures, and she is willing to proceed. - With regards to her abdominal pain, the patient has had extensive workup including ultrasound imaging, CT scans, HIDA scan, upper endoscopy, and colonoscopy, in addition to multiple labs.  Overall there is no clear reason for her abdominal pain although she has had gastritis and H. pylori in the past.  Currently her most recent H. pylori has been negative she is taking an antacid daily but continues having some bloatedness.  Her HIDA scan did show elevated ejection fraction of 98%.  There is potential that she could have subcutaneous biliary hyperkinesia.  Discussed with patient that if everything else turns out negative and there is no explanation for her pain, potentially we could discuss surgery for her gallbladder although there is no guarantee that this will solve her pain issues. - In the meantime, we will schedule her for office excision of her lipoma on 05/05/2023.  All of her questions have been answered.  I spent 30 minutes dedicated to the care of this patient on the date of this encounter to include pre-visit review of  records, face-to-face time with the patient discussing diagnosis and management, and any post-visit coordination of care.   Howie Ill, MD [facility]

## 2023-05-05 ENCOUNTER — Encounter: Payer: Self-pay | Admitting: Surgery

## 2023-05-05 ENCOUNTER — Ambulatory Visit: Payer: BC Managed Care – PPO | Admitting: Surgery

## 2023-05-05 VITALS — BP 133/87 | HR 77 | Temp 98.0°F | Ht 68.0 in | Wt 152.0 lb

## 2023-05-05 DIAGNOSIS — D171 Benign lipomatous neoplasm of skin and subcutaneous tissue of trunk: Secondary | ICD-10-CM

## 2023-05-05 NOTE — Patient Instructions (Signed)
Today we have removed a Lipoma in our office. Please see information below regarding this type of tumor.  You are free to shower tomorrow, do not scrub at the area.   You have glue on your skin and sutures under the skin. The glue will come off on it's own in 10-14 days. You may shower normally until this occurs but do not submerge.  Please use Tylenol or Ibuprofen for pain as needed. You may use ice to the area 3-4 times today and tomorrow for any achiness.   We will see you back in 7-10 days to ensure that this has healed and to review the final pathology. Please see your appointment below. You may continue your regular activities right away but if you are having pain while doing something, stop what you are doing and try this activity once again in 3 days. Please call our office with any questions or concerns prior to your appointment.   Lipoma Removal Lipoma removal is a surgical procedure to remove a noncancerous (benign) tumor that is made up of fat cells (lipoma). Most lipomas are small and painless and do not require treatment. They can form in many areas of the body but are most common under the skin of the back, shoulders, arms, and thighs. You may need lipoma removal if you have a lipoma that is large, growing, or causing discomfort. Lipoma removal may also be done for cosmetic reasons. Tell a health care provider about: Any allergies you have. All medicines you are taking, including vitamins, herbs, eye drops, creams, and over-the-counter medicines. Any problems you or family members have had with anesthetic medicines. Any blood disorders you have. Any surgeries you have had. Any medical conditions you have. Whether you are pregnant or may be pregnant. What are the risks? Generally, this is a safe procedure. However, problems may occur, including: Infection. Bleeding. Allergic reactions to medicines. Damage to nerves or blood vessels near the  lipoma. Scarring.  Medicines Ask your health care provider about: Changing or stopping your regular medicines. This is especially important if you are taking diabetes medicines or blood thinners. Taking medicines such as aspirin and ibuprofen. These medicines can thin your blood. Do not take these medicines before your procedure if your health care provider instructs you not to. You may be given antibiotic medicine to help prevent infection. General instructions Ask your health care provider how your surgical site will be marked or identified. You will have a physical exam. Your health care provider will check the size of the lipoma and whether it can be moved easily.  What happens during the procedure? To reduce your risk of infection: Your health care team will wash or sanitize their hands. Your skin will be washed with surgical soap. You will be given the following: A medicine to numb the area (local anesthetic). An incision will be made over the lipoma or very near the lipoma. The incision may be made in a natural skin line or crease. Tissues, nerves, and blood vessels near the lipoma will be moved out of the way. The lipoma and the capsule that surrounds it will be separated from the surrounding tissues. The lipoma will be removed. The incision may be closed with stitches and surgical glue

## 2023-05-05 NOTE — Progress Notes (Signed)
  Procedure Date:  05/05/2023  Pre-operative Diagnosis:  Right lateral abdominal wall lipoma  Post-operative Diagnosis:  Right lateral abdominal wall lipoma 2 cm.  Procedure:  Excision of right lateral abdominal wall lipoma  Surgeon:  Howie Ill, MD  Anesthesia:  10 ml of 1% lidocaine with epi  Estimated Blood Loss:  5 ml  Specimens:  right lateral abdominal wall lipoma  Complications:  None  Indications for Procedure:  This is a 57 y.o. female with diagnosis of a symptomatic right lateral wall lipoma.  The patient wishes to have this excised. The risks of bleeding, abscess or infection, injury to surrounding structures, and need for further procedures were all discussed with the patient and she was willing to proceed.  Description of Procedure: The patient was correctly identified at bedside.  The patient was placed supine.  Appropriate time-outs were performed.  The patient's right abdominal wall was prepped and draped in usual sterile fashion.  Local anesthetic was infused intradermally.  A 2.5 cm incision was made over the lipoma, and scalpel was used to dissect down the skin and subcutaneous tissue.  Skin flaps were created sharply, and then the lipoma was excised intact.  It was sent off to pathology.  The cavity was then irrigated and hemostasis was assured with manual pressure.  The wound was then closed in two layers using 3-0 Vicryl and 4-0 Monocryl.  The incision was cleaned and sealed with DermaBond.  The patient tolerated the procedure well and all sharps were appropriately disposed of at the end of the case.  --Patient may shower tomorrow. --May take Tylenol or ibuprofen --Avoid strenuous activity x 2 weeks, particularly twisting motion to decrease straining of the wound. --Follow up in 10 days.  Howie Ill, MD

## 2023-05-12 ENCOUNTER — Ambulatory Visit: Payer: BC Managed Care – PPO | Admitting: Surgery

## 2023-05-12 ENCOUNTER — Encounter: Payer: Self-pay | Admitting: Surgery

## 2023-05-12 VITALS — BP 135/88 | HR 73 | Temp 98.0°F | Ht 68.0 in | Wt 153.0 lb

## 2023-05-12 DIAGNOSIS — Z09 Encounter for follow-up examination after completed treatment for conditions other than malignant neoplasm: Secondary | ICD-10-CM

## 2023-05-12 DIAGNOSIS — D171 Benign lipomatous neoplasm of skin and subcutaneous tissue of trunk: Secondary | ICD-10-CM

## 2023-05-12 NOTE — Patient Instructions (Addendum)
 May rub Vitamin-E oil or other emmolient agent in area 2-3 times a day to soften. You will need to use sunscreen for the next year on the area to minimize altered pigmentation of the site.   Follow-up with our office as needed.  Please call and ask to speak with a nurse if you develop questions or concerns.

## 2023-05-12 NOTE — Progress Notes (Signed)
05/12/2023  HPI: Monica Day is a 57 y.o. female s/p excision of right sided abdominal wall lipoma on 05/05/2023.  Patient presents today for follow-up.  Pathology showed this to be a lipoma.  No evidence of atypia or malignancy.  Patient reports that the wound is healing well.  She is also recovering from the fall she had around 4 July weekend.  Vital signs: BP 135/88   Pulse 73   Temp 98 F (36.7 C)   Ht 5\' 8"  (1.727 m)   Wt 153 lb (69.4 kg)   SpO2 98%   BMI 23.26 kg/m    Physical Exam: Constitutional: No acute distress Skin: Right abdominal wall incision is healing well and is clean, dry, intact.  There is some surrounding ecchymosis that is present for about a diameter of 5 cm.  However no drainage or any evidence of infection of the incision.  Assessment/Plan: This is a 57 y.o. female s/p excision of right side abdominal wall lipoma.  - Reviewed with her pathology results showing a normal lipoma without any complicating factors. - Patient is healing well with some surrounding ecchymosis but otherwise no evidence of any complications or infection. - Patient is recovering from her fall that has been given activity precautions for potential muscle tear in her left thigh. - Of note, the patient is seeing gastroenterology for follow-up and workup of postprandial abdominal pain.  She has had extensive workup in the past.  She will follow-up with me in the future if the rest of her workup is negative to potentially discuss the role of cholecystectomy given that she has a very high ejection fraction on HIDA scan.   Howie Ill, MD [facility]

## 2023-12-06 DIAGNOSIS — Z90710 Acquired absence of both cervix and uterus: Secondary | ICD-10-CM | POA: Insufficient documentation

## 2023-12-17 ENCOUNTER — Ambulatory Visit
Admission: EM | Admit: 2023-12-17 | Discharge: 2023-12-17 | Disposition: A | Discharge status: Home / Self Care | Payer: 59 | Attending: Family Medicine | Admitting: Family Medicine

## 2023-12-17 DIAGNOSIS — N760 Acute vaginitis: Secondary | ICD-10-CM | POA: Insufficient documentation

## 2023-12-17 DIAGNOSIS — B3731 Acute candidiasis of vulva and vagina: Secondary | ICD-10-CM | POA: Insufficient documentation

## 2023-12-17 DIAGNOSIS — B9689 Other specified bacterial agents as the cause of diseases classified elsewhere: Secondary | ICD-10-CM | POA: Diagnosis present

## 2023-12-17 LAB — WET PREP, GENITAL
Sperm: NONE SEEN
Trich, Wet Prep: NONE SEEN
WBC, Wet Prep HPF POC: <10 — AB (ref <10)

## 2023-12-17 MED ORDER — METRONIDAZOLE 500 MG PO TABS: 500.0000 mg | ORAL_TABLET | Freq: Two times a day (BID) | ORAL | 0 refills | Status: DC

## 2023-12-17 MED ORDER — FLUCONAZOLE 150 MG PO TABS: 150.0000 mg | ORAL_TABLET | ORAL | 0 refills | Status: AC

## 2023-12-17 NOTE — ED Provider Notes (Signed)
[facility]    CSN: [id number] Arrival date & time: 12/17/23  8841      History   Chief Complaint Chief Complaint  Patient presents with   Groin Swelling     HPI HPI Monica Day is a 58 y.o. female.    Monica Day presents for groin pain and vulvar edema after having intercourse with her husband.  She tried Monistat 3-day that didn't help. She took a bathe with Epsom salt with Lavender on Saturday.  Has soreness with sitting. No smell or discharge.  Doesn't have bladder pain or feel like a bladder infection. No rash.  Has  not had any antibiotics in last 30 days.   Denies known STI exposure.  No LMP recorded. Patient has had a hysterectomy.          Past Medical History:  Diagnosis Date   Hypertension    Lab test positive for detection of COVID-19 virus 09/2019    Patient Active Problem List   Diagnosis Date Noted   Left arm weakness 11/18/2016    Past Surgical History:  Procedure Laterality Date   ABDOMINAL HYSTERECTOMY  01/2011   HIP SURGERY Right 02/2016   Reconstruction   NASAL SINUS SURGERY      OB History   No obstetric history on file.      Home Medications    Prior to Admission medications   Medication Sig Start Date End Date Taking? Authorizing Provider  fluconazole (DIFLUCAN) 150 MG tablet Take 1 tablet (150 mg total) by mouth once a week for 2 doses. 12/17/23 12/25/23 Yes Jahid Weida, DO  lisinopril-hydrochlorothiazide (ZESTORETIC) 10-12.5 MG tablet lisinopril 10 mg-hydrochlorothiazide 12.5 mg tablet   Yes [provider]  metroNIDAZOLE (FLAGYL) 500 MG tablet Take 1 tablet (500 mg total) by mouth 2 (two) times daily. 12/17/23  Yes Micajah Dennin, Seward Meth, DO  propranolol (INNOPRAN XL) 120 MG 24 hr capsule Take by mouth. 05/13/21  Yes [provider]  vitamin B-12 (CYANOCOBALAMIN) 1000 MCG tablet Take 1,000 mcg by mouth daily.    [provider]    Family History Family History  Problem  Relation Age of Onset   Lung cancer Father    Prostate cancer Father    Hypertension Brother    Hypertension Brother     Social History Social History   Tobacco Use   Smoking status: Never    Passive exposure: Never   Smokeless tobacco: Never  Vaping Use   Vaping status: Never Used  Substance Use Topics   Alcohol use: Yes    Comment: occasionally   Drug use: No     Allergies   Mixed ragweed   Review of Systems Review of Systems: :negative unless otherwise stated in HPI.      Physical Exam Triage Vital Signs ED Triage Vitals  Encounter Vitals Group     BP 12/17/23 0909 131/88     Systolic BP Percentile --      Diastolic BP Percentile --      Pulse Rate 12/17/23 0909 83     Resp 12/17/23 0909 17     Temp 12/17/23 0909 99.1 F (37.3 C)     Temp Source 12/17/23 0909 Oral     SpO2 12/17/23 0909 98 %     Weight --      Height --      Head Circumference --      Peak Flow --      Pain Score 12/17/23 0907 3  Pain Loc --      Pain Education --      Exclude from Growth Chart --    No data found.  Updated Vital Signs BP 131/88 (BP Location: Right Arm)   Pulse 83   Temp 99.1 F (37.3 C) (Oral)   Resp 17   SpO2 98%   Visual Acuity Right Eye Distance:   Left Eye Distance:   Bilateral Distance:    Right Eye Near:   Left Eye Near:    Bilateral Near:     Physical Exam GEN: well appearing female in no acute distress  CVS: well perfused  RESP: speaking in full sentences without pause  GU: deferred, patient performed self swab     UC Treatments / Results  Labs (all labs ordered are listed, but only abnormal results are displayed) Labs Reviewed  WET PREP, GENITAL - Abnormal; Notable for the following components:      Result Value   Yeast Wet Prep HPF POC PRESENT (*)    Clue Cells Wet Prep HPF POC PRESENT (*)    WBC, Wet Prep HPF POC <10 (*)    All other components within normal limits    EKG   Radiology No results  found.  Procedures Procedures (including critical care time)  Medications Ordered in UC Medications - No data to display  Initial Impression / Assessment and Plan / UC Course  I have reviewed the triage vital signs and the nursing notes.  Pertinent labs & imaging results that were available during my care of the patient were reviewed by me and considered in my medical decision making (see chart for details).      Patient is a 58 y.o.Marland Kitchen female  who presents for groin pain that started after intercourse with her husband and then taking a bathe.  Overall patient is well-appearing and afebrile.  Vital signs stable. Wet prep showing evidence of yeast vaginitis and bacterial vaginitis but no trichomonas.  - Treatment: Flagyl 500 BID x 7 days and advised patient to not drink alcohol while taking this medication. Diflucan for 2 doses for yeast infection    Return precautions including abdominal pain, fever, chills, nausea, or vomiting given. Discussed MDM, treatment plan and plan for follow-up with patient who agrees with plan.      Final Clinical Impressions(s) / UC Diagnoses   Final diagnoses:  Yeast vaginitis  Bacterial vaginitis     Discharge Instructions      You had evidence of of a bacterial and yeast vaginal infection today.  Stop by the pharmacy to pick up your prescriptions.  For BV/bacterial vaginosis: Take metronidazole twice a day for the next 7 days.  Do not drink any alcohol with taking this medication.  For yeast infection: Take the first dose of Diflucan on day 3 and after you complete your antibiotics take the last dose.  If your symptoms do not improve in the next 7 days, be sure to follow-up here or at your primary care provider office.  Go to the emergency department if you are having increasing pain, worsening vaginal bleeding or fever.      ED Prescriptions     Medication Sig Dispense Auth. Provider   fluconazole (DIFLUCAN) 150 MG tablet Take 1 tablet  (150 mg total) by mouth once a week for 2 doses. 2 tablet Caileb Rhue, DO   metroNIDAZOLE (FLAGYL) 500 MG tablet Take 1 tablet (500 mg total) by mouth 2 (two) times daily. 14 tablet Noa Galvao, Seward Meth, DO  PDMP not reviewed this encounter.   Katha Cabal, DO 12/17/23 0945

## 2023-12-17 NOTE — Discharge Instructions (Addendum)
You had evidence of of a bacterial and yeast vaginal infection today.  Stop by the pharmacy to pick up your prescriptions.  For BV/bacterial vaginosis: Take metronidazole twice a day for the next 7 days.  Do not drink any alcohol with taking this medication.  For yeast infection: Take the first dose of Diflucan on day 3 and after you complete your antibiotics take the last dose.  If your symptoms do not improve in the next 7 days, be sure to follow-up here or at your primary care provider office.  Go to the emergency department if you are having increasing pain, worsening vaginal bleeding or fever.

## 2023-12-17 NOTE — ED Triage Notes (Signed)
Sx 4-5 days  Vagina hurts when she pees. Inner lips feels inflamed. Happened after she had sex. Patient had a lavender bath Sat.  No discharge or itching

## 2024-08-14 ENCOUNTER — Ambulatory Visit (INDEPENDENT_AMBULATORY_CARE_PROVIDER_SITE_OTHER)

## 2024-08-14 ENCOUNTER — Ambulatory Visit
Admission: RE | Admit: 2024-08-14 | Discharge: 2024-08-14 | Disposition: A | Discharge status: Home / Self Care | Source: Ambulatory Visit | Attending: Emergency Medicine | Admitting: Emergency Medicine

## 2024-08-14 ENCOUNTER — Ambulatory Visit: Payer: Self-pay | Admitting: Emergency Medicine

## 2024-08-14 VITALS — BP 158/98 | HR 93 | Temp 98.4°F | Resp 18

## 2024-08-14 DIAGNOSIS — R0789 Other chest pain: Secondary | ICD-10-CM | POA: Diagnosis not present

## 2024-08-14 DIAGNOSIS — S2232XA Fracture of one rib, left side, initial encounter for closed fracture: Secondary | ICD-10-CM

## 2024-08-14 MED ORDER — HYDROCODONE-ACETAMINOPHEN 5-325 MG PO TABS: 1.0000 | ORAL_TABLET | Freq: Four times a day (QID) | ORAL | 0 refills | Status: DC | PRN

## 2024-08-14 MED ORDER — IBUPROFEN 600 MG PO TABS: 600.0000 mg | ORAL_TABLET | Freq: Four times a day (QID) | ORAL | 0 refills | Status: DC | PRN

## 2024-08-14 NOTE — Discharge Instructions (Addendum)
 I do not appreciate any displaced rib fractures, or damage to your lungs.  However, I still suspect that you have broken several ribs.  We will contact you if the radiology overread differs from mine and we need to change management.  Take the ibuprofen  600 mg with a Tylenol  containing product 3-4 times a day as needed for pain.  Either ibuprofen  with 1000 mg of Tylenol  for mild to moderate pain or ibuprofen  with 1-2 Norco for severe pain.  Do not take the Tylenol  and Norco at the same time as they both have Tylenol  in them and too much Tylenol  can hurt your liver.  Do not exceed 4000 mg of Tylenol  from all sources in 1 day.

## 2024-08-14 NOTE — Progress Notes (Signed)
 Nondisplaced buckle type fracture of the right anterior seventh rib.  No pneumothorax or pleural effusion.  Alfonso, could you please call the patient and let her know that she does indeed have a nondisplaced fracture of her seventh rib and continue current management?  Thank you

## 2024-08-14 NOTE — ED Provider Notes (Signed)
 HPI  SUBJECTIVE:  Monica Day is a 58 y.o. female who presents with left lower anterior rib cage pain described as soreness after falling onto her chest 1 week ago.  States that she was pulling on a bed lift 2 days ago, felt something buckle in the area of pain with worsening of the localized pain.  She states the pain is sharp, uncomfortable, constant.  No shortness of breath, coughing, wheezing, fevers, bruising in the area.  She tried ice, ibuprofen  800 mg 3 times daily, heat with improvement in her symptoms.  Last dose of ibuprofen  was within the past 6 hours.  Symptoms worse with lying down, breathing, torso rotation, moving her left arm, coughing, sneezing, deep inspiration.  Past medical history negative for osteoporosis, chronic kidney disease.    Past Medical History:  Diagnosis Date   Hypertension    Lab test positive for detection of COVID-19 virus 09/2019    Past Surgical History:  Procedure Laterality Date   ABDOMINAL HYSTERECTOMY  01/2011   HIP SURGERY Right 02/2016   Reconstruction   NASAL SINUS SURGERY      Family History  Problem Relation Age of Onset   Lung cancer Father    Prostate cancer Father    Hypertension Brother    Hypertension Brother     Social History   Tobacco Use   Smoking status: Never    Passive exposure: Never   Smokeless tobacco: Never  Vaping Use   Vaping status: Never Used  Substance Use Topics   Alcohol use: Yes    Comment: occasionally   Drug use: No    No current facility-administered medications for this encounter.  Current Outpatient Medications:    HYDROcodone-acetaminophen  (NORCO/VICODIN) 5-325 MG tablet, Take 1-2 tablets by mouth every 6 (six) hours as needed for moderate pain (pain score 4-6) or severe pain (pain score 7-10)., Disp: 12 tablet, Rfl: 0   ibuprofen  (ADVIL ) 600 MG tablet, Take 1 tablet (600 mg total) by mouth every 6 (six) hours as needed., Disp: 30 tablet, Rfl: 0   Cholecalciferol  50 MCG (2000 UT)  CAPS, Take by oral route., Disp: , Rfl:    lisinopril-hydrochlorothiazide  (ZESTORETIC) 10-12.5 MG tablet, lisinopril 10 mg-hydrochlorothiazide  12.5 mg tablet, Disp: , Rfl:    propranolol (INNOPRAN XL) 120 MG 24 hr capsule, Take by mouth., Disp: , Rfl:    vitamin B-12 (CYANOCOBALAMIN) 1000 MCG tablet, Take 1,000 mcg by mouth daily., Disp: , Rfl:   Allergies  Allergen Reactions   Mixed Ragweed Other (See Comments)   Other Other (See Comments)     ROS  As noted in HPI.   Physical Exam  BP (!) 158/98   Pulse 93   Temp 98.4 F (36.9 C) (Oral)   Resp 18   SpO2 99%   Constitutional: Well developed, well nourished, no acute distress Eyes:  EOMI, conjunctiva normal bilaterally HENT: Normocephalic, atraumatic,mucus membranes moist Respiratory: Normal inspiratory effort.  Normal appearance.  No paradoxical chest wall tenderness, bruising.  Point tenderness along ribs 7/8/9.  No crepitus.  Lungs clear bilaterally.  No sternal tenderness.  No other tenderness along the rib cage. Cardiovascular: Normal rate, regular rhythm, no murmurs rubs gallops GI: nondistended skin: No rash, skin intact Musculoskeletal: no deformities Neurologic: Alert & oriented x 3, no focal neuro deficits Psychiatric: Speech and behavior appropriate   ED Course   Medications - No data to display  Orders Placed This Encounter  Procedures   DG Ribs Unilateral W/Chest Left  Standing Status:   Standing    Number of Occurrences:   1    Reason for Exam (SYMPTOM  OR DIAGNOSIS REQUIRED):   Direct fall onto anterior lower left ribs.  Point tenderness along 7/8/9.  Rule out fracture, pneumothorax, pleural effusion, pneumonia.    No results found for this or any previous visit (from the past 24 hours). DG Ribs Unilateral W/Chest Left Result Date: 08/14/2024 CLINICAL DATA:  Direct fall onto anterior lower left ribs. Point tenderness along 7/8/9. Rule out fracture, pneumothorax, pleural effusion, pneumonia. EXAM:  LEFT RIBS AND CHEST - 3+ VIEW COMPARISON:  09/30/2011 FINDINGS: No focal airspace consolidation, pleural effusion, or pneumothorax. No cardiomegaly. Subtle cortical irregularity along the anterior aspect of the right seventh rib. IMPRESSION: Findings suggestive of a nondisplaced, buckle type fracture of the right anterior seventh rib. No pneumothorax or pleural effusion. Electronically Signed   By: Rogelia Myers M.D.   On: 08/14/2024 11:44    ED Clinical Impression  1. Closed fracture of one rib of left side, initial encounter   2. Rib pain on left side      ED Assessment/Plan     Suspect rib fractures.  There is no evidence of paradoxical chest wall tenderness/flail chest.  Will get left rib series.  [place] Narcotic database reviewed for this patient, and feel that the risk/benefit ratio today is favorable for proceeding with a prescription for controlled substance.  Reviewed imaging independently.  No displaced fracture, pneumothorax, pulmonary edema or effusion.  Formal radiology overread pending.  Will contact patient [phone] if radiology overread differs enough mine and we need to change management.    Reviewed radiology report.  Nondisplaced buckle fracture seventh anterior rib.  No pneumothorax or pleural effusion consistent with my read.  See radiology report for full details.  Sent patient MyChart note and will have staff notify her of nondisplaced fracture.  However this does not change management.  Discussed imaging, MDM, treatment plan, and plan for follow-up with patient. Discussed sn/sx that should prompt return to the ED. patient agrees with plan.   Meds ordered this encounter  Medications   HYDROcodone-acetaminophen  (NORCO/VICODIN) 5-325 MG tablet    Sig: Take 1-2 tablets by mouth every 6 (six) hours as needed for moderate pain (pain score 4-6) or severe pain (pain score 7-10).    Dispense:  12 tablet    Refill:  0   ibuprofen  (ADVIL ) 600 MG tablet    Sig: Take 1  tablet (600 mg total) by mouth every 6 (six) hours as needed.    Dispense:  30 tablet    Refill:  0      *This clinic note was created using Scientist, clinical (histocompatibility and immunogenetics). Therefore, there may be occasional mistakes despite careful proofreading.  ?    Van Knee, MD 08/15/24 1537

## 2024-08-14 NOTE — ED Triage Notes (Signed)
 Pt presents with left side rib cage x 1 week. She initially injured her left side falling on the rim of her pool. Then 2 days ago she felt a pop when she was helping to move her father in law. She has taken 800mg  of motrin  and heating pads for relief.

## 2024-11-06 ENCOUNTER — Emergency Department
Admission: EM | Admit: 2024-11-06 | Discharge: 2024-11-06 | Discharge status: Against Medical Advice | Source: Home / Self Care

## 2024-11-08 ENCOUNTER — Ambulatory Visit
Admission: EM | Admit: 2024-11-08 | Discharge: 2024-11-08 | Disposition: A | Discharge status: Home / Self Care | Attending: Emergency Medicine | Admitting: Emergency Medicine

## 2024-11-08 DIAGNOSIS — E785 Hyperlipidemia, unspecified: Secondary | ICD-10-CM | POA: Insufficient documentation

## 2024-11-08 DIAGNOSIS — N879 Dysplasia of cervix uteri, unspecified: Secondary | ICD-10-CM | POA: Insufficient documentation

## 2024-11-08 DIAGNOSIS — S161XXA Strain of muscle, fascia and tendon at neck level, initial encounter: Secondary | ICD-10-CM

## 2024-11-08 DIAGNOSIS — S060XAA Concussion with loss of consciousness status unknown, initial encounter: Secondary | ICD-10-CM | POA: Diagnosis not present

## 2024-11-08 DIAGNOSIS — H6993 Unspecified Eustachian tube disorder, bilateral: Secondary | ICD-10-CM

## 2024-11-08 DIAGNOSIS — N83209 Unspecified ovarian cyst, unspecified side: Secondary | ICD-10-CM | POA: Insufficient documentation

## 2024-11-08 DIAGNOSIS — S300XXA Contusion of lower back and pelvis, initial encounter: Secondary | ICD-10-CM

## 2024-11-08 DIAGNOSIS — W19XXXA Unspecified fall, initial encounter: Secondary | ICD-10-CM

## 2024-11-08 DIAGNOSIS — I10 Essential (primary) hypertension: Secondary | ICD-10-CM | POA: Insufficient documentation

## 2024-11-08 DIAGNOSIS — S62639A Displaced fracture of distal phalanx of unspecified finger, initial encounter for closed fracture: Secondary | ICD-10-CM | POA: Insufficient documentation

## 2024-11-08 DIAGNOSIS — N390 Urinary tract infection, site not specified: Secondary | ICD-10-CM | POA: Insufficient documentation

## 2024-11-08 DIAGNOSIS — N943 Premenstrual tension syndrome: Secondary | ICD-10-CM | POA: Insufficient documentation

## 2024-11-08 DIAGNOSIS — R928 Other abnormal and inconclusive findings on diagnostic imaging of breast: Secondary | ICD-10-CM | POA: Insufficient documentation

## 2024-11-08 DIAGNOSIS — S8010XA Contusion of unspecified lower leg, initial encounter: Secondary | ICD-10-CM | POA: Insufficient documentation

## 2024-11-08 DIAGNOSIS — S20219A Contusion of unspecified front wall of thorax, initial encounter: Secondary | ICD-10-CM | POA: Insufficient documentation

## 2024-11-08 DIAGNOSIS — S0990XA Unspecified injury of head, initial encounter: Secondary | ICD-10-CM

## 2024-11-08 DIAGNOSIS — S83419A Sprain of medial collateral ligament of unspecified knee, initial encounter: Secondary | ICD-10-CM | POA: Insufficient documentation

## 2024-11-08 MED ORDER — IBUPROFEN 600 MG PO TABS: 600.0000 mg | ORAL_TABLET | Freq: Four times a day (QID) | ORAL | 0 refills | Status: AC | PRN

## 2024-11-08 MED ORDER — BACLOFEN 10 MG PO TABS: 10.0000 mg | ORAL_TABLET | Freq: Three times a day (TID) | ORAL | 0 refills | Status: AC

## 2024-11-08 NOTE — ED Provider Notes (Signed)
 " [facility]    CSN: [id number] Arrival date & time: 11/08/24  9185      History   Chief Complaint Chief Complaint  Patient presents with   Head Injury   Fall    HPI Monica Day is a 59 y.o. female.   HPI  58 year old female with a past medical history significant for hypertension, GERD, hyperlipidemia, and chronic tension type headaches presents for evaluation of musculoskeletal complaints after suffering a fall.  Unknown loss of consciousness.  No nausea, vomiting, changes in vision, numbness, tingling, weakness in any of her extremities.  Past Medical History:  Diagnosis Date   Hypertension    Lab test positive for detection of COVID-19 virus 09/2019    Patient Active Problem List   Diagnosis Date Noted   Abnormal mammogram 11/08/2024   Closed fracture of distal phalanx of finger 11/08/2024   Contusion of chest 11/08/2024   Contusion of multiple sites of lower limb 11/08/2024   Cyst of ovary 11/08/2024   Dysplasia of cervix 11/08/2024   Hyperlipidemia 11/08/2024   Hypertensive disorder 11/08/2024   Premenstrual tension syndrome 11/08/2024   Sprain of MCL (medial collateral ligament) of knee 11/08/2024   Urinary tract infectious disease 11/08/2024   H/O total hysterectomy 12/06/2023   Abdominal bloating 03/10/2023   Diarrhea 03/10/2023   Reduced libido 12/03/2022   Urge incontinence 11/19/2021   Health care maintenance 11/17/2021   Gastroesophageal reflux disease without esophagitis 06/18/2021   Insomnia 10/03/2019   Hemorrhoids 02/01/2019   Chronic tension-type headache, not intractable 10/29/2017   Left arm weakness 11/18/2016   Knee pain 07/19/2015   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      Home Medications    Prior to Admission medications  Medication Sig Start Date End Date Taking? Authorizing Provider  baclofen  (LIORESAL ) 10 MG tablet Take 1 tablet (10 mg total) by mouth 3 (three) times daily. 11/08/24  Yes Bernardino Ditch, NP  ibuprofen  (ADVIL ) 600 MG tablet Take 1 tablet (600 mg total) by mouth every 6 (six) hours as needed. 11/08/24  Yes Bernardino Ditch, NP  Cholecalciferol  50 MCG (2000 UT) CAPS Take by oral route.    [provider]  lisinopril-hydrochlorothiazide  (ZESTORETIC) 10-12.5 MG tablet lisinopril 10 mg-hydrochlorothiazide  12.5 mg tablet    [provider]  propranolol (INNOPRAN XL) 120 MG 24 hr capsule Take by mouth. 05/13/21   [provider]  vitamin B-12 (CYANOCOBALAMIN) 1000 MCG tablet Take 1,000 mcg by mouth daily.    [provider]    Family History Family History  Problem Relation Age of Onset   Lung cancer Father    Prostate cancer Father    Hypertension Brother    Hypertension Brother     Social History Social History[1]   Allergies   Mixed ragweed and Other   Review of Systems Review of Systems  Eyes:  Negative for visual disturbance.  Gastrointestinal:  Negative for nausea and vomiting.  Musculoskeletal:  Positive for back pain and neck pain.  Skin:  Negative for color change.  Neurological:  Positive for headaches. Negative for syncope, weakness and numbness.  Physical Exam Triage Vital Signs ED Triage Vitals  Encounter Vitals Group     BP      Girls Systolic BP Percentile      Girls Diastolic BP Percentile      Boys Systolic BP Percentile      Boys Diastolic BP Percentile      Pulse      Resp      Temp      Temp src      SpO2      Weight      Height      Head Circumference      Peak Flow      Pain Score      Pain Loc      Pain Education      Exclude from Growth Chart    No data found.  Updated Vital  Signs BP (!) 175/94 (BP Location: Left Arm)   Pulse 82   Temp 98.9 F (37.2 C) (Oral)   Resp 18   Wt 159 lb 14.4 oz (72.5 kg)   SpO2 97%   BMI 24.31 kg/m   Visual Acuity Right Eye Distance:   Left Eye Distance:   Bilateral Distance:    Right Eye Near:   Left Eye Near:    Bilateral Near:     Physical Exam Vitals and nursing note reviewed.  Constitutional:      Appearance: Normal appearance. She is not ill-appearing.  HENT:     Head: Normocephalic and atraumatic.     Right Ear: Tympanic membrane, ear canal and external ear normal. There is no impacted cerumen.     Left Ear: Tympanic membrane, ear canal and external ear normal. There is no impacted cerumen.     Ears:     Comments: No hemotympanum or effusion noted bilaterally.    Mouth/Throat:     Mouth: Mucous membranes are moist.     Pharynx: Oropharynx is clear. No oropharyngeal exudate or posterior oropharyngeal erythema.  Eyes:     General: No scleral icterus.       Right eye: No discharge.        Left eye: No discharge.     Extraocular Movements: Extraocular movements intact.     Conjunctiva/sclera: Conjunctivae normal.     Pupils: Pupils are equal, round, and reactive to light.  Cardiovascular:     Rate and Rhythm: Normal rate and regular rhythm.     Pulses: Normal pulses.     Heart sounds: Normal heart sounds. No murmur heard.    No friction rub. No gallop.  Pulmonary:     Effort: Pulmonary effort is normal.     Breath sounds: Normal breath sounds. No wheezing, rhonchi or rales.  Musculoskeletal:        General: Tenderness and signs of injury present.     Cervical back: Normal range of motion and neck supple. Tenderness present.  Lymphadenopathy:     Cervical: No cervical adenopathy.  Skin:    General: Skin is warm and dry.     Capillary Refill: Capillary refill takes less than 2 seconds.     Findings: No bruising or erythema.  Neurological:     General: No focal deficit present.     Mental Status: She  is alert and oriented to person, place, and time.     Cranial Nerves: No cranial nerve deficit.     Sensory: No sensory deficit.     Motor: No weakness.     Gait: Gait  normal.     Deep Tendon Reflexes: Reflexes normal.      UC Treatments / Results  Labs (all labs ordered are listed, but only abnormal results are displayed) Labs Reviewed - No data to display  EKG   Radiology No results found.  Procedures Procedures (including critical care time)  Medications Ordered in UC Medications - No data to display  Initial Impression / Assessment and Plan / UC Course  I have reviewed the triage vital signs and the nursing notes.  Pertinent labs & imaging results that were available during my care of the patient were reviewed by me and considered in my medical decision making (see chart for details).   Patient is a pleasant, nontoxic-appearing 59 year old female presenting for evaluation of musculoskeletal complaints after suffering a ground-level fall.  She reports that she was walking up her steps carrying unarmed full of empty boxes when she became off balance and fell backwards.  She reports that she landed on her butt first, then her left shoulder, then her head.  She does not remember whether or not she had a loss of consciousness.  She did call her husband who took her to the hospital but they left due to the wait time.  She came in today because she is experiencing headache and pressure in her left ear and ringing.  Also some pain to the left side of her neck and at her tailbone.  Her physical exam reveals cranial nerves II through XII intact, pupils equal round reactive and EOM's intact.  Patient is moving all extremities independently and has 5/5 bilateral grips, upper extremity strength, and lower extremity strength.  Her DTRs are 2+ globally.  She has no midline spinous process tenderness in her cervical, thoracic, or lumbar spine.  Does have mild tenderness with palpation of the  sacrum and coccyx though no crepitus.  She also has tenderness and tension in her left cervical paraspinous region.  Based on the patient's exam she has a mild concussion, cervical strain, and sacral contusion.  I do not feel imaging is necessary at this time.  She is also experiencing some eustachian tube dysfunction as she has fullness in the left ear but she is unable to clear her eustachian tube using the Valsalva maneuver.  I will have her use over-the-counter Flonase  to help alleviate that as well as periodically perform the Valsalva to clear the eustachian tube.  I will treat the musculoskeletal complaints with a mixture of 6 and milligrams ibuprofen  every 6 hours and 10 mg of baclofen  every 8 hours along with home physical therapy and moist heat.  Return to ER precautions reviewed.   Final Clinical Impressions(s) / UC Diagnoses   Final diagnoses:  Fall, initial encounter  Injury of head, initial encounter  Concussion with unknown loss of consciousness status, initial encounter  Cervical strain, acute, initial encounter  Sacral contusion, initial encounter  Eustachian tube dysfunction, bilateral     Discharge Instructions      Rest is much as possible.  Avoid tablets, computer screens, gaming systems, or phones as the refrigerate might make your headache worse.  Limit television time to no more than 2, 30-minute shows a day.  You may read printed books.  You have no dietary restrictions.  Take the ibuprofen , 600 mg every 6 hours with food, on a schedule for the next 48 hours and then as needed.  Take the baclofen , 10 mg every 8 hours, on a schedule for the next 48 hours and  then as needed.  Apply moist heat to your neck/back for 30 minutes at a time 2-3 times a day to improve blood flow to the area and help remove the lactic acid causing the spasm.  Follow the neck/back exercises given at discharge.  For your eustachian tube dysfunction use over-the-counter Flonase  at  bedtime, 2 squirts up each nostril.  Also periodically throughout the day try and pop your ears to clear eustachian tubes.  Return for reevaluation for any new or worsening symptoms.   If you develop any change in behavior, severe headache relieved with Tylenol  and ibuprofen , forceful nausea and vomiting, unequal pupils, or lethargy please go to the ER for evaluation.      ED Prescriptions     Medication Sig Dispense Auth. Provider   ibuprofen  (ADVIL ) 600 MG tablet Take 1 tablet (600 mg total) by mouth every 6 (six) hours as needed. 30 tablet Bernardino Ditch, NP   baclofen  (LIORESAL ) 10 MG tablet Take 1 tablet (10 mg total) by mouth 3 (three) times daily. 30 each Bernardino Ditch, NP      PDMP not reviewed this encounter.    [1]  Social History Tobacco Use   Smoking status: Never    Passive exposure: Never   Smokeless tobacco: Never  Vaping Use   Vaping status: Never Used  Substance Use Topics   Alcohol use: Yes    Comment: occasionally   Drug use: No     Bernardino Ditch, NP 11/08/24 [phone]  "

## 2024-11-08 NOTE — ED Triage Notes (Addendum)
 Pt c/o head injury that happened yesterday. States she fell off her stoop while carrying some boxes & hit the back of her head on concrete. Unsure of LOC. Went to [facility] ED but [facility] d/t wait time. Currently c/o HA, L ear pressure & ringing. Denies any blurred vision or photophobia.

## 2024-11-08 NOTE — Discharge Instructions (Addendum)
 Rest is much as possible.  Avoid tablets, computer screens, gaming systems, or phones as the refrigerate might make your headache worse.  Limit television time to no more than 2, 30-minute shows a day.  You may read printed books.  You have no dietary restrictions.  Take the ibuprofen , 600 mg every 6 hours with food, on a schedule for the next 48 hours and then as needed.  Take the baclofen , 10 mg every 8 hours, on a schedule for the next 48 hours and then as needed.  Apply moist heat to your neck/back for 30 minutes at a time 2-3 times a day to improve blood flow to the area and help remove the lactic acid causing the spasm.  Follow the neck/back exercises given at discharge.  For your eustachian tube dysfunction use over-the-counter Flonase  at bedtime, 2 squirts up each nostril.  Also periodically throughout the day try and pop your ears to clear eustachian tubes.  Return for reevaluation for any new or worsening symptoms.   If you develop any change in behavior, severe headache relieved with Tylenol  and ibuprofen , forceful nausea and vomiting, unequal pupils, or lethargy please go to the ER for evaluation.
